# Patient Record
Sex: Female | Born: 1940 | Race: White | Hispanic: No | Smoking: Former smoker
Health system: Southern US, Community
[De-identification: ages and names within clinical notes are randomized; demographics above are authoritative.]

## PROBLEM LIST (undated history)

## (undated) DIAGNOSIS — E559 Vitamin D deficiency, unspecified: Secondary | ICD-10-CM

## (undated) DIAGNOSIS — K5730 Diverticulosis of large intestine without perforation or abscess without bleeding: Secondary | ICD-10-CM

## (undated) DIAGNOSIS — R490 Dysphonia: Secondary | ICD-10-CM

## (undated) DIAGNOSIS — C50919 Malignant neoplasm of unspecified site of unspecified female breast: Secondary | ICD-10-CM

## (undated) DIAGNOSIS — E663 Overweight: Secondary | ICD-10-CM

## (undated) DIAGNOSIS — J381 Polyp of vocal cord and larynx: Secondary | ICD-10-CM

## (undated) DIAGNOSIS — K635 Polyp of colon: Secondary | ICD-10-CM

## (undated) DIAGNOSIS — Z923 Personal history of irradiation: Secondary | ICD-10-CM

## (undated) DIAGNOSIS — I639 Cerebral infarction, unspecified: Secondary | ICD-10-CM

## (undated) DIAGNOSIS — I341 Nonrheumatic mitral (valve) prolapse: Secondary | ICD-10-CM

## (undated) DIAGNOSIS — F341 Dysthymic disorder: Secondary | ICD-10-CM

## (undated) DIAGNOSIS — K219 Gastro-esophageal reflux disease without esophagitis: Secondary | ICD-10-CM

## (undated) DIAGNOSIS — E7800 Pure hypercholesterolemia, unspecified: Secondary | ICD-10-CM

## (undated) HISTORY — DX: Gastro-esophageal reflux disease without esophagitis: K21.9

## (undated) HISTORY — DX: Vitamin D deficiency, unspecified: E55.9

## (undated) HISTORY — DX: Diverticulosis of large intestine without perforation or abscess without bleeding: K57.30

## (undated) HISTORY — DX: Cerebral infarction, unspecified: I63.9

## (undated) HISTORY — DX: Nonrheumatic mitral (valve) prolapse: I34.1

## (undated) HISTORY — DX: Polyp of colon: K63.5

## (undated) HISTORY — DX: Dysthymic disorder: F34.1

## (undated) HISTORY — PX: APPENDECTOMY: SHX54

## (undated) HISTORY — DX: Polyp of vocal cord and larynx: J38.1

## (undated) HISTORY — DX: Dysphonia: R49.0

## (undated) HISTORY — PX: BREAST EXCISIONAL BIOPSY: SUR124

## (undated) HISTORY — PX: BREAST LUMPECTOMY: SHX2

## (undated) HISTORY — DX: Pure hypercholesterolemia, unspecified: E78.00

## (undated) HISTORY — PX: BREAST BIOPSY: SHX20

## (undated) HISTORY — DX: Overweight: E66.3

## (undated) NOTE — *Deleted (*Deleted)
Patient Care Team: Adrian Prince, MD as PCP - General (Endocrinology)  DIAGNOSIS: No diagnosis found.  SUMMARY OF ONCOLOGIC HISTORY: Oncology History  Ductal carcinoma in situ (DCIS) of right breast  07/26/2014 Initial Diagnosis   Stereotactic biopsy right breast 12:00: Intraductal hyperplasia with no atypia, 18 mm group of calcifications 8 o'clock position high-grade DCIS ER 95% PR 60% positive at [facility]   08/09/2014 Surgery   Right lumpectomy followed by reexcision for negative margins   11/01/2014 - 12/01/2014 Radiation Therapy   Adjuvant radiation therapy   01/01/2015 -  Anti-estrogen oral therapy   Anastrozole 1 mg daily     CHIEF COMPLIANT: Follow-up of right breast DCIS on anastrozole  INTERVAL HISTORY: Victoria Thornton is a 42 y.o. with above-mentioned history of right breast DCIS treated with a lumpectomy, radiation, and who is currently on anti-estrogen therapy with anastrozole. Mammogram on 05/09/19 showed no evidence of malignancy bilaterally. She presents to the clinic today for annual follow-up.   ALLERGIES:  has No Known Allergies.  MEDICATIONS:  Current Outpatient Medications  Medication Sig Dispense Refill  . anastrozole (ARIMIDEX) 1 MG tablet TAKE 1 TABLET BY MOUTH EVERY DAY 90 tablet 3  . Calcium Carbonate-Vitamin D (CALTRATE 600+D) 600-400 MG-UNIT per tablet Take 1 tablet by mouth 2 (two) times daily.     . Cholecalciferol (VITAMIN D3) 2000 UNITS TABS Take 2,000 Units by mouth daily.     Marland Kitchen glyBURIDE (DIABETA) 5 MG tablet Take 2 tablets (10 mg total) by mouth daily with breakfast.    . lisinopril (PRINIVIL,ZESTRIL) 10 MG tablet Take 1 tablet (10 mg total) by mouth daily. 90 tablet 3  . magnesium oxide (MAG-OX) 400 (241.3 Mg) MG tablet Take 1 tablet (400 mg total) by mouth daily.    . metFORMIN (GLUCOPHAGE) 500 MG tablet Take 2 tablets (1,000 mg total) by mouth 2 (two) times daily with a meal. 180 tablet 1  . Multiple Vitamins-Minerals  (WOMENS DAILY FORMULA PO) Take 1 capsule by mouth daily.      . sertraline (ZOLOFT) 50 MG tablet Take 50 mg by mouth daily.    . simvastatin (ZOCOR) 20 MG tablet Take 1 tablet (20 mg total) by mouth at bedtime. 90 tablet 0  . sitaGLIPtin (JANUVIA) 100 MG tablet Take 1 tablet (100 mg total) by mouth daily.    Marland Kitchen zolpidem (AMBIEN) 10 MG tablet Take 0.5 tablets (5 mg total) by mouth at bedtime as needed for sleep.     No current facility-administered medications for this visit.    PHYSICAL EXAMINATION: ECOG PERFORMANCE STATUS: {CHL ONC ECOG PS:[phone]}  There were no vitals filed for this visit. There were no vitals filed for this visit.  BREAST:*** No palpable masses or nodules in either right or left breasts. No palpable axillary supraclavicular or infraclavicular adenopathy no breast tenderness or nipple discharge. (exam performed in the presence of a chaperone)  LABORATORY DATA:  I have reviewed the data as listed CMP Latest Ref Rng & Units 03/04/2019 03/03/2019 03/01/2019  Glucose 70 - 99 mg/dL - 409(W) 119(J)  BUN 8 - 23 mg/dL - 19 15  Creatinine 4.78 - 1.00 mg/dL - 2.95(A) 2.13  Sodium 135 - 145 mmol/L 134(L) 135 138  Potassium 3.5 - 5.1 mmol/L 4.1 4.1 4.3  Chloride 98 - 111 mmol/L - 99 101  CO2 22 - 32 mmol/L - 25 26  Calcium 8.9 - 10.3 mg/dL - 9.2 9.6  Total Protein 6.0 - 8.3 g/dL - - -  Total Bilirubin 0.3 - 1.2 mg/dL - - -  Alkaline Phos 39 - 117 U/L - - -  AST 0 - 37 U/L - - -  ALT 0 - 35 U/L - - -    Lab Results  Component Value Date   WBC 13.5 (H) 03/03/2019   HGB 10.5 (L) 03/04/2019   HCT 31.0 (L) 03/04/2019   MCV 92.1 03/03/2019   PLT 226 03/03/2019   NEUTROABS 8.6 (H) 03/03/2019    ASSESSMENT & PLAN:  No problem-specific Assessment & Plan notes found for this encounter.    No orders of the defined types were placed in this encounter.  The patient has a good understanding of the overall plan. she agrees with it. she will call with any problems that  may develop before the next visit here.  Total time spent: *** mins including face to face time and time spent for planning, charting and coordination of care  Serena Croissant, MD 03/26/2020  I, Kirt Boys Dorshimer, am acting as scribe for Dr. Serena Croissant.  {insert scribe attestation}

---
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
## 1993-05-19 HISTORY — PX: BREAST CYST ASPIRATION: SHX578

## 1996-05-19 HISTORY — PX: OTHER SURGICAL HISTORY: SHX169

## 1997-09-20 ENCOUNTER — Ambulatory Visit (HOSPITAL_BASED_OUTPATIENT_CLINIC_OR_DEPARTMENT_OTHER): Admission: RE | Admit: 1997-09-20 | Discharge: 1997-09-20 | Payer: Self-pay | Admitting: *Deleted

## 1998-03-22 ENCOUNTER — Ambulatory Visit (HOSPITAL_COMMUNITY): Admission: RE | Admit: 1998-03-22 | Discharge: 1998-03-22 | Payer: Self-pay | Admitting: Pulmonary Disease

## 1998-03-22 ENCOUNTER — Encounter: Payer: Self-pay | Admitting: Pulmonary Disease

## 1998-04-09 ENCOUNTER — Ambulatory Visit (HOSPITAL_COMMUNITY): Admission: RE | Admit: 1998-04-09 | Discharge: 1998-04-09 | Payer: Self-pay | Admitting: Pulmonary Disease

## 1998-04-09 ENCOUNTER — Encounter: Payer: Self-pay | Admitting: Pulmonary Disease

## 1998-06-26 ENCOUNTER — Other Ambulatory Visit: Admission: RE | Admit: 1998-06-26 | Discharge: 1998-06-26 | Payer: Self-pay | Admitting: Obstetrics and Gynecology

## 1999-10-07 ENCOUNTER — Other Ambulatory Visit: Admission: RE | Admit: 1999-10-07 | Discharge: 1999-10-07 | Payer: Self-pay | Admitting: Obstetrics and Gynecology

## 2000-12-07 ENCOUNTER — Other Ambulatory Visit: Admission: RE | Admit: 2000-12-07 | Discharge: 2000-12-07 | Payer: Self-pay | Admitting: Obstetrics and Gynecology

## 2001-05-11 ENCOUNTER — Emergency Department (HOSPITAL_COMMUNITY): Admission: EM | Admit: 2001-05-11 | Discharge: 2001-05-11 | Payer: Self-pay | Admitting: Emergency Medicine

## 2001-05-11 ENCOUNTER — Encounter: Payer: Self-pay | Admitting: Emergency Medicine

## 2001-11-29 ENCOUNTER — Other Ambulatory Visit: Admission: RE | Admit: 2001-11-29 | Discharge: 2001-11-29 | Payer: Self-pay | Admitting: Obstetrics and Gynecology

## 2002-06-08 ENCOUNTER — Encounter: Admission: RE | Admit: 2002-06-08 | Discharge: 2002-09-06 | Payer: Self-pay | Admitting: Endocrinology

## 2002-10-03 ENCOUNTER — Encounter: Admission: RE | Admit: 2002-10-03 | Discharge: 2003-01-01 | Payer: Self-pay | Admitting: Endocrinology

## 2003-02-13 ENCOUNTER — Other Ambulatory Visit: Admission: RE | Admit: 2003-02-13 | Discharge: 2003-02-13 | Payer: Self-pay | Admitting: Obstetrics and Gynecology

## 2004-03-04 ENCOUNTER — Other Ambulatory Visit: Admission: RE | Admit: 2004-03-04 | Discharge: 2004-03-04 | Payer: Self-pay | Admitting: Obstetrics and Gynecology

## 2004-03-08 ENCOUNTER — Ambulatory Visit (HOSPITAL_COMMUNITY): Admission: RE | Admit: 2004-03-08 | Discharge: 2004-03-08 | Payer: Self-pay | Admitting: Orthopaedic Surgery

## 2004-04-05 ENCOUNTER — Ambulatory Visit: Payer: Self-pay | Admitting: Pulmonary Disease

## 2004-04-20 ENCOUNTER — Inpatient Hospital Stay (HOSPITAL_COMMUNITY): Admission: EM | Admit: 2004-04-20 | Discharge: 2004-04-21 | Payer: Self-pay | Admitting: Emergency Medicine

## 2004-04-21 ENCOUNTER — Inpatient Hospital Stay (HOSPITAL_COMMUNITY): Admission: EM | Admit: 2004-04-21 | Discharge: 2004-04-23 | Payer: Self-pay | Admitting: Psychiatry

## 2004-04-21 ENCOUNTER — Ambulatory Visit: Payer: Self-pay | Admitting: Psychiatry

## 2004-11-19 ENCOUNTER — Emergency Department (HOSPITAL_COMMUNITY): Admission: EM | Admit: 2004-11-19 | Discharge: 2004-11-19 | Payer: Self-pay | Admitting: Emergency Medicine

## 2005-03-31 ENCOUNTER — Other Ambulatory Visit: Admission: RE | Admit: 2005-03-31 | Discharge: 2005-03-31 | Payer: Self-pay | Admitting: Obstetrics and Gynecology

## 2005-04-17 ENCOUNTER — Ambulatory Visit: Payer: Self-pay | Admitting: Pulmonary Disease

## 2005-05-08 ENCOUNTER — Ambulatory Visit: Payer: Self-pay | Admitting: Pulmonary Disease

## 2006-06-15 ENCOUNTER — Other Ambulatory Visit: Admission: RE | Admit: 2006-06-15 | Discharge: 2006-06-15 | Payer: Self-pay | Admitting: Obstetrics & Gynecology

## 2006-06-16 ENCOUNTER — Ambulatory Visit: Payer: Self-pay | Admitting: Pulmonary Disease

## 2006-06-16 LAB — CONVERTED CEMR LAB
AST: 14 units/L (ref 0–37)
Alkaline Phosphatase: 64 units/L (ref 39–117)
Basophils Relative: 0.9 % (ref 0.0–1.0)
CO2: 30 meq/L (ref 19–32)
Chloride: 106 meq/L (ref 96–112)
Creatinine, Ser: 0.8 mg/dL (ref 0.4–1.2)
HCT: 39 % (ref 36.0–46.0)
Hemoglobin: 13.7 g/dL (ref 12.0–15.0)
Hgb A1c MFr Bld: 6.2 % — ABNORMAL HIGH (ref 4.6–6.0)
LDL Cholesterol: 112 mg/dL — ABNORMAL HIGH (ref 0–99)
Monocytes Absolute: 0.5 10*3/uL (ref 0.2–0.7)
Neutrophils Relative %: 64.5 % (ref 43.0–77.0)
Potassium: 4.4 meq/L (ref 3.5–5.1)
RBC: 4.48 M/uL (ref 3.87–5.11)
RDW: 11.8 % (ref 11.5–14.6)
Sodium: 142 meq/L (ref 135–145)
TSH: 1.76 microintl units/mL (ref 0.35–5.50)
Total Bilirubin: 0.8 mg/dL (ref 0.3–1.2)
Total Protein: 7.4 g/dL (ref 6.0–8.3)
VLDL: 33 mg/dL (ref 0–40)
WBC: 6.8 10*3/uL (ref 4.5–10.5)

## 2006-11-30 ENCOUNTER — Ambulatory Visit: Payer: Self-pay | Admitting: Gastroenterology

## 2006-12-04 ENCOUNTER — Ambulatory Visit: Payer: Self-pay | Admitting: Gastroenterology

## 2007-06-17 DIAGNOSIS — K219 Gastro-esophageal reflux disease without esophagitis: Secondary | ICD-10-CM

## 2007-06-17 DIAGNOSIS — D126 Benign neoplasm of colon, unspecified: Secondary | ICD-10-CM

## 2007-06-18 ENCOUNTER — Ambulatory Visit: Payer: Self-pay | Admitting: Pulmonary Disease

## 2007-06-18 ENCOUNTER — Encounter: Payer: Self-pay | Admitting: Adult Health

## 2007-06-18 DIAGNOSIS — I059 Rheumatic mitral valve disease, unspecified: Secondary | ICD-10-CM | POA: Insufficient documentation

## 2007-06-18 DIAGNOSIS — J42 Unspecified chronic bronchitis: Secondary | ICD-10-CM | POA: Insufficient documentation

## 2007-06-18 DIAGNOSIS — K5730 Diverticulosis of large intestine without perforation or abscess without bleeding: Secondary | ICD-10-CM | POA: Insufficient documentation

## 2007-06-18 DIAGNOSIS — E663 Overweight: Secondary | ICD-10-CM | POA: Insufficient documentation

## 2007-06-18 DIAGNOSIS — E7800 Pure hypercholesterolemia, unspecified: Secondary | ICD-10-CM | POA: Insufficient documentation

## 2007-06-18 DIAGNOSIS — E119 Type 2 diabetes mellitus without complications: Secondary | ICD-10-CM | POA: Insufficient documentation

## 2007-06-18 DIAGNOSIS — F341 Dysthymic disorder: Secondary | ICD-10-CM

## 2007-06-18 DIAGNOSIS — J381 Polyp of vocal cord and larynx: Secondary | ICD-10-CM

## 2007-06-18 DIAGNOSIS — R498 Other voice and resonance disorders: Secondary | ICD-10-CM

## 2007-06-25 LAB — CONVERTED CEMR LAB: Vit D, 1,25-Dihydroxy: 23 — ABNORMAL LOW (ref 30–89)

## 2007-06-26 LAB — CONVERTED CEMR LAB
ALT: 10 units/L (ref 0–35)
AST: 18 units/L (ref 0–37)
Alkaline Phosphatase: 64 units/L (ref 39–117)
BUN: 9 mg/dL (ref 6–23)
Basophils Relative: 0.9 % (ref 0.0–1.0)
CO2: 31 meq/L (ref 19–32)
Calcium: 9.3 mg/dL (ref 8.4–10.5)
Chloride: 101 meq/L (ref 96–112)
Crystals: NEGATIVE
Eosinophils Absolute: 0.2 10*3/uL (ref 0.0–0.6)
Eosinophils Relative: 2.4 % (ref 0.0–5.0)
GFR calc Af Amer: 108 mL/min
Glucose, Bld: 136 mg/dL — ABNORMAL HIGH (ref 70–99)
HCT: 37.4 % (ref 36.0–46.0)
HDL: 45.8 mg/dL (ref >39.0)
Ketones, ur: NEGATIVE mg/dL
MCV: 87.5 fL (ref 78.0–100.0)
Mucus, UA: NEGATIVE
Neutrophils Relative %: 61 % (ref 43.0–77.0)
Platelets: 294 10*3/uL (ref 150–400)
RBC: 4.28 M/uL (ref 3.87–5.11)
TSH: 3.23 microintl units/mL (ref 0.35–5.50)
Total Protein, Urine: NEGATIVE mg/dL
Total Protein: 7.6 g/dL (ref 6.0–8.3)
Triglycerides: 132 mg/dL (ref 0–149)
VLDL: 26 mg/dL (ref 0–40)
WBC: 9 10*3/uL (ref 4.5–10.5)

## 2007-06-29 ENCOUNTER — Telehealth (INDEPENDENT_AMBULATORY_CARE_PROVIDER_SITE_OTHER): Payer: Self-pay | Admitting: *Deleted

## 2008-04-05 ENCOUNTER — Ambulatory Visit: Payer: Self-pay | Admitting: Pulmonary Disease

## 2008-07-31 ENCOUNTER — Ambulatory Visit: Payer: Self-pay | Admitting: Pulmonary Disease

## 2008-08-01 ENCOUNTER — Ambulatory Visit: Payer: Self-pay | Admitting: Family Medicine

## 2008-08-01 ENCOUNTER — Ambulatory Visit: Payer: Self-pay | Admitting: Pulmonary Disease

## 2008-08-01 ENCOUNTER — Other Ambulatory Visit: Admission: RE | Admit: 2008-08-01 | Discharge: 2008-08-01 | Payer: Self-pay | Admitting: Obstetrics & Gynecology

## 2008-08-02 LAB — CONVERTED CEMR LAB
Albumin: 3.9 g/dL (ref 3.5–5.2)
Basophils Relative: 0.8 % (ref 0.0–3.0)
Bilirubin Urine: NEGATIVE
CO2: 30 meq/L (ref 19–32)
Calcium: 9.1 mg/dL (ref 8.4–10.5)
Creatinine, Ser: 0.4 mg/dL (ref 0.4–1.2)
Creatinine,U: 220.7 mg/dL
Eosinophils Relative: 3 % (ref 0.0–5.0)
Folate: 12.3 ng/mL
GFR calc non Af Amer: 168.92 mL/min (ref >60)
Glucose, Bld: 214 mg/dL — ABNORMAL HIGH (ref 70–99)
HDL: 37.8 mg/dL — ABNORMAL LOW (ref >39.00)
Hemoglobin: 13.5 g/dL (ref 12.0–15.0)
Lymphocytes Relative: 38.1 % (ref 12.0–46.0)
Microalb Creat Ratio: 5.4 mg/g (ref 0.0–30.0)
Monocytes Relative: 5.8 % (ref 3.0–12.0)
Neutro Abs: 4.3 10*3/uL (ref 1.4–7.7)
Nitrite: NEGATIVE
RBC: 4.49 M/uL (ref 3.87–5.11)
Total CHOL/HDL Ratio: 5
Total Protein, Urine: NEGATIVE mg/dL
Total Protein: 7.3 g/dL (ref 6.0–8.3)
Triglycerides: 134 mg/dL (ref 0.0–149.0)
Urobilinogen, UA: 0.2 (ref 0.0–1.0)
VLDL: 26.8 mg/dL (ref 0.0–40.0)
Vitamin B-12: 485 pg/mL (ref 211–911)

## 2008-08-09 ENCOUNTER — Telehealth: Payer: Self-pay | Admitting: Pulmonary Disease

## 2008-11-23 ENCOUNTER — Encounter: Payer: Self-pay | Admitting: Pulmonary Disease

## 2008-11-29 ENCOUNTER — Encounter: Payer: Self-pay | Admitting: Pulmonary Disease

## 2009-01-30 ENCOUNTER — Ambulatory Visit: Payer: Self-pay | Admitting: Pulmonary Disease

## 2009-02-04 LAB — CONVERTED CEMR LAB
BUN: 16 mg/dL (ref 6–23)
Chloride: 104 meq/L (ref 96–112)
Hgb A1c MFr Bld: 7.9 % — ABNORMAL HIGH (ref 4.6–6.5)
Potassium: 4.3 meq/L (ref 3.5–5.1)

## 2009-02-05 ENCOUNTER — Telehealth: Payer: Self-pay | Admitting: Pulmonary Disease

## 2009-02-20 ENCOUNTER — Telehealth: Payer: Self-pay | Admitting: Pulmonary Disease

## 2009-07-31 ENCOUNTER — Ambulatory Visit: Payer: Self-pay | Admitting: Pulmonary Disease

## 2009-08-01 ENCOUNTER — Ambulatory Visit: Payer: Self-pay | Admitting: Pulmonary Disease

## 2009-08-04 DIAGNOSIS — E559 Vitamin D deficiency, unspecified: Secondary | ICD-10-CM | POA: Insufficient documentation

## 2009-08-04 LAB — CONVERTED CEMR LAB
BUN: 19 mg/dL (ref 6–23)
Basophils Relative: 0.4 % (ref 0.0–3.0)
Cholesterol: 194 mg/dL (ref 0–200)
Creatinine, Ser: 0.8 mg/dL (ref 0.4–1.2)
Eosinophils Relative: 3 % (ref 0.0–5.0)
GFR calc non Af Amer: 75.68 mL/min (ref >60)
HCT: 40 % (ref 36.0–46.0)
HDL: 50 mg/dL (ref >39.00)
Hemoglobin: 13.2 g/dL (ref 12.0–15.0)
Lymphs Abs: 3.2 10*3/uL (ref 0.7–4.0)
MCV: 88.1 fL (ref 78.0–100.0)
Monocytes Absolute: 0.5 10*3/uL (ref 0.1–1.0)
Potassium: 4.2 meq/L (ref 3.5–5.1)
RBC: 4.54 M/uL (ref 3.87–5.11)
Total Bilirubin: 0.4 mg/dL (ref 0.3–1.2)
VLDL: 40.2 mg/dL — ABNORMAL HIGH (ref 0.0–40.0)
WBC: 8.7 10*3/uL (ref 4.5–10.5)

## 2009-10-25 ENCOUNTER — Telehealth (INDEPENDENT_AMBULATORY_CARE_PROVIDER_SITE_OTHER): Payer: Self-pay | Admitting: *Deleted

## 2009-12-14 ENCOUNTER — Encounter: Payer: Self-pay | Admitting: Pulmonary Disease

## 2009-12-20 ENCOUNTER — Telehealth: Payer: Self-pay | Admitting: Pulmonary Disease

## 2010-01-28 ENCOUNTER — Ambulatory Visit: Payer: Self-pay | Admitting: Pulmonary Disease

## 2010-01-29 LAB — CONVERTED CEMR LAB
CO2: 31 meq/L (ref 19–32)
GFR calc non Af Amer: 70.47 mL/min (ref >60)
Glucose, Bld: 165 mg/dL — ABNORMAL HIGH (ref 70–99)
Potassium: 4.1 meq/L (ref 3.5–5.1)
Sodium: 142 meq/L (ref 135–145)

## 2010-06-17 ENCOUNTER — Telehealth: Payer: Self-pay | Admitting: Pulmonary Disease

## 2010-06-18 NOTE — Progress Notes (Signed)
Summary: refills  metformin and glimepiride  Phone Note Call from Patient Call back at Home Phone [phone]   Caller: Patient Call For: Aaliayah Miao Reason for Call: Refill Medication Summary of Call: Needs written rxs for metformin hcl 500mg  amd glimepiride 2mg  90-day supply with three refills mailed to her. Initial call taken by: Darletta Moll,  December 20, 2009 1:35 PM  Follow-up for Phone Call        pt was last seen by SN 07-31-2009 and is scheduled for 6 month f/u with SN on 01-28-2010.  Printed rx for Sn to sign   rx have been placed in the mail for pt [name] CMA  December 20, 2009 3:44 PM     Prescriptions: GLIMEPIRIDE 2 MG TABS (GLIMEPIRIDE) take 1 tab by mouth once daily in the AM...  #90 x 3   Entered by:   Arman Filter LPN   Authorized by:   Michele Mcalpine MD   Signed by:   Arman Filter LPN on 09/81/1914   Method used:   Print then Mail to Patient   RxID:   [id number] METFORMIN HCL 500 MG TABS (METFORMIN HCL) 1 by mouth two times a day  #180 x 3   Entered by:   Arman Filter LPN   Authorized by:   Michele Mcalpine MD   Signed by:   Arman Filter LPN on 69/62/9528   Method used:   Print then Mail to Patient   RxID:   [id number]

## 2010-06-18 NOTE — Assessment & Plan Note (Signed)
Summary: rov 6 months///kp   Primary Care Provider:  Kriste Basque  CC:  6 month ROV & review of mult medical problems....  History of Present Illness: 70 y/o WF here for a yearly follow up visit... she lives in [place], [place] but still maintains her medical care here in [place]...     ~  Mar10:  she reports a rough time of it lately- mostly revolving around depression w/ no energy, no motivation, ahedonia, etc... she is followed by [facility]/textile technologist) and sees psychologist Phylliss Blakes... she is on Celexa 40mg /d, and wonders if Adderall may be more beneficial to her (a girlfriend rec this med)...  [place] to [place] when able... she wants to be sure there is no medical reason for her to feel this way. <labs showed BS=214 & Metform500Bid started)>  ~  Sep10:  recent travel to [place] and congested etc- took ZPak & improved... 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 started on Metformin 500Bid after last OV, but not checking BS at home... had eye check from Optometrist w/ report of early retinopathy & rec for 59mo follow up... also had BMD 3/10 which was WNL.. <labs showed BS=194, A1c=7.9 & Glimep2mg  started>   ~  July 31, 2009:  feeling OK & preparing for trip to [place]- had polio booster, has malaria med, wants ZPak... states BS 120-140 range but wt up to 207#... otherw no new complaints or concerns... 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 she is an ex-smoker and quit  ~ 22yrs ago... seen by DrShoemaker for ENT in 1998.  Hx of BRONCHITIS, CHRONIC (ICD-491.9) - ex-smoker quit  ~2001... she has min cough, no sputum, chr DOE without change, and denies CP, palpit, CHF symptoms, edema, etc...  ~  CXR 3/10 showed clear, NAD.Marland Kitchen. stable, no change 3/11...  MITRAL VALVE PROLAPSE (ICD-424.0)  - clinical dx... never had 2DEcho... intermittent msc heard over the years... no CP, palpit, etc...  HYPERCHOLESTEROLEMIA - prev on meds but pt stopped them on her own and is using red yeast rice and OTC fish oil supplements...   ~  FLP 1/08 showed TChol 187, TG 165, HDL 42, LDL 112... she would consider statin therapy is "absolutely necessary" based on her labs, but is more inclined to use diet and supplements...  ~  FLP 1/09 showed TChol 205, TG 132, HDL 46, LDL 141... offered low dose Crestor, she prefers diet.  ~  FLP 3/10 showed TChol 195, TG 134, HDL 38, LDL 130  ~  FLP 3/11 showed TChol 194, TG 201, HDL 50, LDL 126  DIABETES MELLITUS - review of chart show FBS ~120-130 range & HgA1c= low6's for several yrs on diet alone... she is asymptomatic, prev eval by endocrinologist in [place], [place]...  ~  labs 1/09  (wt=210#) showed FBS= 136, HgA1c= 6.9.Marland KitchenMarland Kitchen rec to start meds but she prefers diet Rx...  ~  labs 3/10 (wt=209#) showed BS= 214, A1c= not done... rec> start METFORMIN 500Bid.  ~  labs 9/10 (wt=200#) showed BS= 194, A1c= 7.9.Marland Kitchen. rec> add GLIMEPIRIDE 2mg /d.  ~  labs 3/11 (wt=207#) showed BS= 160, A1c= 6.9.Marland KitchenMarland Kitchen continue both meds.  OVERWEIGHT (ICD-278.02) - not really dieting or exercising by her admission... we discussed diet + exercise program--- and we gave her  the phone # for [facility] to consider Bariatric options...  GERD (ICD-530.81) - some reflux laryngitis suspected in the past... without prev EGD... she has had increased heartburn symptoms recently and we discussed starting PRILOSEC 20mg /d taken 30 min before dinner...  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 f/u planned 5 yrs.  VITAMIN D DEFICIENCY (ICD-268.9)  ~  labs 3/11 showed Vit D level = 20... rec> start 2000 u Vit D OTC daily...  DYSTHYMIA (ICD-300.4) - hx anxiety and depression in past... [facility] (sees Dr. Jules Schick) and prev on Celexa... prev suicidal gesture w/ tylenol overdose after divorce from her 3rd husb...   ~  3/10:  she notes increased symptoms of depression and will f/u w DrPittman and psychologist Phylliss Blakes...   Allergies: 1)  ! Metformin Hcl (Metformin Hcl) 2)  ! Lipitor (Atorvastatin Calcium)  Comments:  Nurse/Medical Assistant: The patient's medications and allergies were reviewed with the patient and were updated in the Medication and Allergy Lists.  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 Father died age 47 w/ MI, CABG, prostate cancer Mother died age 1 w/ DM & depression 2 Brothers- Donnell Lambe, our pt w/ hx of Chol, HBP...  Social History: Reviewed history from 07/31/2008 and no changes required. Divorced 1 daughter= [place], age 65 w/ MS Former smoker.  Quit in 1999.  Smoked 1 ppd x 40 years. 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  The patient denies anorexia, fever, weight loss, weight gain, vision loss, decreased hearing, chest pain, syncope, peripheral edema, prolonged cough, headaches, hemoptysis, abdominal pain, melena, hematochezia, severe indigestion/heartburn, hematuria, incontinence, muscle weakness, suspicious skin lesions, transient blindness, difficulty walking, depression, unusual weight change,  abnormal bleeding, enlarged lymph nodes, and angioedema.    Vital Signs:  Patient profile:   70 year old female Height:      64 inches Weight:      206.38 pounds BMI:     35.55 O2 Sat:      97 % on Room air Temp:     97.1 degrees F oral Pulse rate:   70 / minute BP sitting:   124 / 78  (left arm) Cuff size:   regular  Vitals Entered By: [name] CMA (July 31, 2009 11:51 AM)  O2 Sat at Rest %:  97 O2 Flow:  Room air CC: 6 month ROV & review of mult medical problems... Is Patient Diabetic? Yes Pain Assessment Patient in pain? no      Comments meds updated today   Physical Exam  Additional Exam:  WD, Overweight, 70 y/o WF in NAD... GENERAL:  Alert & oriented; pleasant & cooperative. HEENT:  [place]/AT, EOM-wnl, PERRLA, Fundi-benign, EACs-clear, TMs-wnl, NOSE-clear, THROAT-clear & wnl, sl hoarse without change. NECK:  Supple w/ fairROM; no JVD; normal carotid impulses w/o bruits; no thyromegaly or nodules palpated; no lymphadenopathy. CHEST:  Clear without wheezes, rales, or rhonchi heard... HEART:  Regular Rhythm; without murmurs/ rubs/ or gallops detected... ABDOMEN:  Soft & nontender; normal bowel sounds; no organomegaly or masses palpated... EXT: without deformities, mild arthritic changes; no varicose veins/ venous insuffic/ or edema. NEURO:  CN's intact, no focal neuro deficits... DERM:  neg- without lesions seen, no rash, etc...    CXR  Procedure date:  07/31/2009  Findings:      CHEST - 2 VIEW Comparison: None.   Findings:  The heart size and mediastinal contours are within normal limits.  Both lungs are clear.  The visualized skeletal structures are unremarkable.   IMPRESSION: No active cardiopulmonary disease.   Read By:  Jonne Ply,  M.D.   MISC. Report  Procedure date:  08/01/2009  Findings:      Lipid Panel (LIPID)   Cholesterol               194 mg/dL                   4-332   Triglycerides        [H]  201.0 mg/dL                  9.5-188.4   HDL                       16.60 mg/dL                 >63.01 Cholesterol LDL - Direct                             126.1 mg/dL  BMP (METABOL)   Sodium                    140 mEq/L                   135-145   Potassium                 4.2 mEq/L                   3.5-5.1   Chloride                  105 mEq/L                   96-112   Carbon Dioxide            28 mEq/L                    19-32   Glucose              [H]  160 mg/dL                   60-10   BUN                       19 mg/dL                    9-32   Creatinine                0.8 mg/dL                   3.5-5.7   Calcium                   9.3 mg/dL  8.4-10.5   GFR                       75.68 mL/min                >60  Hepatic/Liver Function Panel (HEPATIC)   Total Bilirubin           0.4 mg/dL                   1.3-0.8   Direct Bilirubin          0.2 mg/dL                   6.5-7.8   Alkaline Phosphatase      64 U/L                      39-117   AST                       16 U/L                      0-37   ALT                       10 U/L                      0-35   Total Protein             7.5 g/dL                    4.6-9.6   Albumin                   4.2 g/dL                    2.9-5.2  Comments:      CBC Platelet w/Diff (CBCD)   White Cell Count          8.7 K/uL                    4.5-10.5   Red Cell Count            4.54 Mil/uL                 3.87-5.11   Hemoglobin                13.2 g/dL                   84.1-32.4   Hematocrit                40.0 %                      36.0-46.0   MCV                       88.1 fl                     78.0-100.0   Platelet Count            259.0 K/uL                  150.0-400.0   Neutrophil %              53.9 %  43.0-77.0   Lymphocyte %              36.5 %                      12.0-46.0   Monocyte %                6.2 %                       3.0-12.0   Eosinophils%              3.0 %                       0.0-5.0   Basophils  %               0.4 %                       0.0-3.0  TSH (TSH)   FastTSH                   3.29 uIU/mL                 0.35-5.50  Hemoglobin A1C (A1C)   Hemoglobin A1C       [H]  6.9 %                       4.6-6.5  Vitamin D (25-Hydroxy) (30865)  Vitamin D (25-Hydroxy)                        [L]  20 ng/mL                    30-89   Impression & Recommendations:  Problem # 1:  Hx of BRONCHITIS, CHRONIC (ICD-491.9) CXR clear, no recent exac... Orders: T-2 View CXR (71020TC)  Problem # 2:  HYPERCHOLESTEROLEMIA (ICD-272.0) FLP similar on diet alone, she declines statin Rx.  Problem # 3:  DIABETES MELLITUS (ICD-250.00) BS & A1c sl improved... continue meds, needs better diet!!! The following medications were removed from the medication list:    Aspirin 81 Mg Tbec (Aspirin) .Marland Kitchen... Take 1 tab by mouth once daily... Her updated medication list for this problem includes:    Metformin Hcl 500 Mg Tabs (Metformin hcl) .Marland Kitchen... 1 by mouth two times a day    Glimepiride 2 Mg Tabs (Glimepiride) .Marland Kitchen... Take 1 tab by mouth once daily in the am...  Problem # 4:  OVERWEIGHT (ICD-278.02) Weight reduction is key...  Problem # 5:  GERD (ICD-530.81) GI is stable and up to date...  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  Problem # 7:  VITAMIN D DEFICIENCY (ICD-268.9) Start Vit D 2000 u OTC daily...  Complete Medication List: 1)  Metformin Hcl 500 Mg Tabs (Metformin hcl) .Marland Kitchen.. 1 by mouth two times a day 2)  Glimepiride 2 Mg Tabs (Glimepiride) .... Take 1 tab by mouth once daily in the am... 3)  Zithromax Z-pak 250 Mg Tabs (Azithromycin) .... Take as directed...  Other Orders: Prescription Created Electronically [phone])  Patient Instructions: 1)  Today we updated your med list- see below.... 2)  We refilled your meds per request & added a ZPak for as needed use.Marland KitchenMarland Kitchen 3)  Today we did your follow up CXR, please return to our lab in the AM for your  FASTING  blood work... then call the "phone tree" in a few days for your lab results.Marland KitchenMarland Kitchen 4)  Let's get on track w/ our diet & exercise program... the goal is to lose 15-20 lbs... 5)  Have a great time in [place]!!! 6)  Call for any questions.Marland KitchenMarland Kitchen 7)  Please schedule a follow-up appointment in 6 months. Prescriptions: ZITHROMAX Z-PAK 250 MG TABS (AZITHROMYCIN) take as directed...  #1 pack x 2   Entered and Authorized by:   Michele Mcalpine MD   Signed by:   Michele Mcalpine MD on 07/31/2009   Method used:   Print then Give to Patient   RxID:   [id number] GLIMEPIRIDE 2 MG TABS (GLIMEPIRIDE) take 1 tab by mouth once daily in the AM...  #30 x prn   Entered and Authorized by:   Michele Mcalpine MD   Signed by:   Michele Mcalpine MD on 07/31/2009   Method used:   Print then Give to Patient   RxID:   [id number] METFORMIN HCL 500 MG TABS (METFORMIN HCL) 1 by mouth two times a day  #60 x prn   Entered and Authorized by:   Michele Mcalpine MD   Signed by:   Michele Mcalpine MD on 07/31/2009   Method used:   Print then Give to Patient   RxID:   [id number]

## 2010-06-18 NOTE — Assessment & Plan Note (Signed)
Summary: 41M RECK/KLW   Primary Care Provider:  Kriste Basque  CC:  6 month ROV & review of mult medical problems....  History of Present Illness: 70 y/o WF here for a yearly follow up visit... she lives in [place], [place] but still maintains her medical care here in [place].   ~  Mar10:  she reports a rough time of it lately- mostly revolving around depression w/ no energy, no motivation, ahedonia, etc... she is followed by [facility]/textile technologist) and sees psychologist Phylliss Blakes... she is on Celexa 40mg /d, and wonders if Adderall may be more beneficial to her (a girlfriend rec this med)...  [place]... she wants to be sure there is no medical reason for her to feel this way. <labs showed BS=214 & Metform500Bid started)>  ~  Sep10:  recent travel to [place] and congested etc- took ZPak & improved... 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 started on Metformin 500Bid after last OV, but not checking BS at home... had eye check from Optometrist w/ report of early retinopathy & rec for 31mo follow up... also had BMD 3/10 which was WNL.. <labs showed BS=194, A1c=7.9 & Glimep2mg  started>   ~  July 31, 2009:  feeling OK & preparing for trip to [place]- had polio booster, has malaria med, wants ZPak... states BS 120-140 range but wt up to 207#... otherw no new complaints or concerns... <labs today improved;  low Vit D- rec 2000 u daily>   ~  January 28, 2010:  31mo ROV- feeling well & preparing for trip to [place] (wants Cipro & ZPak)... no bronchitic symptoms, CXR 3/11 NAD, & voice stable;  Chol controlled on diet alone but LDL not at goal & she will continue diet/ exercise/ etc;  BS improved at home w/ MetformBid + Glimep2 & diet (wt down 4#);  she is seeing Ortho in [place]- on Pred5mg /d x30d & this really helped but warned re her DM control;  she didn't stick w/ the Vit D 2000u daily & asked to restart.   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 she is an ex-smoker and quit  ~2001... seen by DrShoemaker for ENT in 1998.  Hx of BRONCHITIS, CHRONIC (ICD-491.9) - ex-smoker quit  ~2001... she has min cough, no sputum, chr DOE without change, and denies CP, palpit, CHF symptoms, edema, etc...  ~  CXR 3/10 showed clear, NAD.Marland Kitchen. stable, no change 3/11...  MITRAL VALVE PROLAPSE (ICD-424.0) - clinical dx... never had 2DEcho... intermittent msc heard over the years... no CP, palpit, etc...  HYPERCHOLESTEROLEMIA - prev on meds but pt stopped them on her own and is using red yeast rice and OTC fish oil supplements...   ~  FLP 1/08 showed TChol 187, TG 165, HDL 42, LDL 112... she would consider statin therapy is "absolutely necessary" based on her labs, but is more inclined to use diet and supplements...  ~  FLP 1/09 showed TChol 205, TG 132, HDL 46, LDL 141... offered low dose Crestor, she prefers diet.  ~  FLP 3/10 showed TChol 195, TG 134, HDL 38, LDL 130  ~  FLP 3/11 showed TChol 194, TG 201, HDL 50, LDL 126  DIABETES MELLITUS - review of chart show FBS ~120-130 range & HgA1c= low6's for several yrs on diet alone... she is asymptomatic, prev eval  by endocrinologist in [place], [place]...  ~  labs 1/09  (wt=210#) showed FBS= 136, HgA1c= 6.9.Marland KitchenMarland Kitchen rec to start meds but she prefers diet Rx...  ~  labs 3/10 (wt=209#) showed BS= 214, A1c= not done... rec> start METFORMIN 500Bid.  ~  labs 9/10 (wt=200#) showed BS= 194, A1c= 7.9.Marland Kitchen. rec> add GLIMEPIRIDE 2mg /d.  ~  labs 3/11 (wt=207#) showed BS= 160, A1c= 6.9.Marland KitchenMarland Kitchen continue both meds.  ~  labs 9/11 (wt=203#) showed BS= 165, A1c= 6.7  OVERWEIGHT (ICD-278.02) - not really dieting or exercising by her admission... we discussed diet + exercise program--- and we have given her the phone # for [facility] to consider Bariatric options...  GERD (ICD-530.81) - some reflux laryngitis suspected in the  past... without prev EGD... she has had increased heartburn symptoms recently and we discussed starting PRILOSEC 20mg /d taken 30 min before dinner (she uses this Prn only).  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 f/u planned 5 yrs.  VITAMIN D DEFICIENCY (ICD-268.9)  ~  labs 3/11 showed Vit D level = 20... rec> start 2000 u Vit D OTC daily & stay on this.  DYSTHYMIA (ICD-300.4) - hx anxiety and depression in past... followed by [facility] (sees Dr. Jules Schick) and prev on Celexa... prev suicidal gesture w/ tylenol overdose after divorce from her 3rd husb...   ~  3/10:  she notes increased symptoms of depression and will f/u w DrPittman and psychologist Phylliss Blakes...  ~  9/11:  she reports Rx w/ WELLBUTRIN 75mg /d.   Preventive Screening-Counseling & Management  Alcohol-Tobacco     Smoking Status: quit     Year Quit: 1999  Allergies: 1)  ! Metformin Hcl (Metformin Hcl) 2)  ! Lipitor (Atorvastatin Calcium)  Comments:  Nurse/Medical Assistant: The patient's medications and allergies were reviewed with the patient and were updated in the Medication and Allergy Lists.  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 Father died age 57 w/ MI, CABG, prostate cancer Mother died age 47 w/ DM & depression 2 Brothers- Donnell Lambe, our pt w/ hx of Chol, HBP.  Social History: Reviewed history  from 07/31/2008 and no changes required. Divorced 1 daughter= [place], age 24 w/ MS Former smoker.  Quit in 1999.  Smoked 1 ppd x 40 years. 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  The patient denies anorexia, fever, weight loss, weight gain, vision loss, decreased hearing, chest pain, syncope, peripheral edema, prolonged cough, headaches, hemoptysis, abdominal pain, melena, hematochezia, severe indigestion/heartburn, hematuria, incontinence, muscle weakness, suspicious skin lesions, transient blindness, depression, unusual weight change, abnormal bleeding, enlarged lymph nodes, and angioedema.    Vital Signs:  Patient profile:   70 year old female Height:      64 inches Weight:      202.50 pounds BMI:     34.88 O2 Sat:      98 % on Room air Temp:     97.3 degrees F  oral Pulse rate:   104 / minute BP sitting:   132 / 84  (right arm) Cuff size:   regular  Vitals Entered By: [name] CMA (January 28, 2010 2:35 PM)  O2 Sat at Rest %:  98 O2 Flow:  Room air CC: 6 month ROV & review of mult medical problems... Is Patient Diabetic? Yes Pain Assessment Patient in pain? no      Comments meds updated today with pt   Physical Exam  Additional Exam:  WD, Overweight, 70 y/o WF in NAD... GENERAL:  Alert & oriented; pleasant & cooperative. HEENT:  [place]/AT, EOM-wnl, PERRLA, Fundi-benign, EACs-clear, TMs-wnl, NOSE-clear, THROAT-clear & wnl, sl hoarse without change. NECK:  Supple w/ fairROM; no JVD; normal carotid impulses w/o bruits; no thyromegaly or nodules palpated; no lymphadenopathy. CHEST:  Clear without wheezes, rales, or rhonchi heard... HEART:  Regular Rhythm; without murmurs/ rubs/ or gallops detected... ABDOMEN:  Soft & nontender; normal bowel sounds; no organomegaly or masses palpated... EXT: without deformities, mild  arthritic changes; no varicose veins/ venous insuffic/ or edema. NEURO:  CN's intact, no focal neuro deficits... DERM:  neg- without lesions seen, no rash, etc...    MISC. Report  Procedure date:  01/28/2010  Findings:      BMP (METABOL)   Sodium                    142 mEq/L                   135-145   Potassium                 4.1 mEq/L                   3.5-5.1   Chloride                  102 mEq/L                   96-112   Carbon Dioxide            31 mEq/L                    19-32   Glucose              [H]  165 mg/dL                   16-10   BUN                       15 mg/dL                    9-60   Creatinine                0.9 mg/dL                   4.5-4.0   Calcium                   9.5 mg/dL                   9.8-11.9   GFR                       70.47 mL/min                >60  Hemoglobin A1C (A1C)   Hemoglobin A1C       [  H]  6.7 %                       4.6-6.5   Impression & Recommendations:  Problem # 1:  Hx of BRONCHITIS, CHRONIC (ICD-491.9) Ex-smoker & breathing is stable... discussed diet & exercise- get wt down to help DOE...  Problem # 2:  HYPERCHOLESTEROLEMIA (ICD-272.0) Her LDL is not at goal & she doesn't want med Rx>  discussed low chol/ low fat diet & wt reduction...  Problem # 3:  DIABETES MELLITUS (ICD-250.00) Stable on Metform + Glimep... needs better diet & wt reduction to keep from having to incr meds over time & she understands this... Her updated medication list for this problem includes:    Metformin Hcl 500 Mg Tabs (Metformin hcl) .Marland Kitchen... 1 by mouth two times a day    Glimepiride 2 Mg Tabs (Glimepiride) .Marland Kitchen... Take 1 tab by mouth once daily in the am...  Orders: TLB-BMP (Basic Metabolic Panel-BMET) (80048-METABOL) TLB-A1C / Hgb A1C (Glycohemoglobin) (83036-A1C)  Problem # 4:  GERD (ICD-530.81) Rec to take the Prilosec regularly as discussed...  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  Problem # 6:  VITAMIN D DEFICIENCY (ICD-268.9) Rec to stay on the Vit D 2000 u daily...  Problem # 7:  DYSTHYMIA (ICD-300.4) Followed by DrPittman on Wellbutrin per pt hx...  Complete Medication List: 1)  Metformin Hcl 500 Mg Tabs (Metformin hcl) .Marland Kitchen.. 1 by mouth two times a day 2)  Glimepiride 2 Mg Tabs (Glimepiride) .... Take 1 tab by mouth once daily in the am... 3)  Prednisone 5 Mg Tabs (Prednisone) .... Take 1 tablet by mouth once a day x 30 days per drhamilton... 4)  Caltrate 600+d 600-400 Mg-unit Tabs (Calcium carbonate-vitamin d) .... Take 1 tab by mouth once daily.Marland KitchenMarland Kitchen 5)  Womens Multivitamin Plus Tabs (Multiple vitamins-minerals) .... Take 1 tab by mouth once daily.Marland KitchenMarland Kitchen 6)  Vitamin D3 2000 Unit Caps (Cholecalciferol) .... Take 1 cap by mouth once daily.Marland KitchenMarland Kitchen 7)  Wellbutrin 75 Mg Tabs (Bupropion hcl) .... Take 1 tablet by mouth once a day 8)  Zithromax Z-pak 250 Mg Tabs (Azithromycin) .... Take as directed... 9)  Ciprofloxacin Hcl 250 Mg Tabs (Ciprofloxacin hcl) .... Take 1 tab by mouth two times a day as directed...  Patient Instructions: 1)  Today we updated your med list- see below.... 2)  We wrote new perscriptions for ZPak & Cipro per request... 3)  Today we did your follow up DM lab work... please call the "phone tree" in a few days for your lab results.Marland KitchenMarland Kitchen 4)  Have a great time in [place]!!! 5)  Please schedule a follow-up appointment in 6 months, & we will plan FASTING blood work at that time... Prescriptions: CIPROFLOXACIN HCL 250 MG TABS (CIPROFLOXACIN HCL) take 1 tab by mouth two times a day as directed...  #14 x 0   Entered and Authorized by:   Michele Mcalpine MD   Signed by:   Michele Mcalpine MD on 01/28/2010   Method used:   Print then Give to Patient   RxID:   [id number] ZITHROMAX Z-PAK 250 MG TABS (AZITHROMYCIN) take as directed...  #1 x 0   Entered and Authorized by:   Michele Mcalpine MD   Signed by:   Michele Mcalpine MD on 01/28/2010   Method used:    Print then Give to Patient   RxID:   [id number]  Immunization History:  Influenza Immunization History:    Influenza:  historical (01/23/2010)

## 2010-06-18 NOTE — Progress Notes (Signed)
Summary: poison ivy  Phone Note Call from Patient Call back at Home Phone [phone]   Caller: Patient Call For: nadel Summary of Call: Poison ivy since last week, wants a prednisone dose pack call in.//[facility] Initial call taken by: Darletta Moll,  October 25, 2009 9:18 AM  Follow-up for Phone Call        per sn ok for prednisone 5mg  pack 6 day taper take as directed  pt aware rx sent to pharmacy Follow-up by: Philipp Deputy CMA,  October 25, 2009 10:08 AM    New/Updated Medications: PREDNISONE (PAK) 5 MG TABS (PREDNISONE) 6 day pack take as directed Prescriptions: PREDNISONE (PAK) 5 MG TABS (PREDNISONE) 6 day pack take as directed  #1 x 0   Entered by:   Philipp Deputy CMA   Authorized by:   Michele Mcalpine MD   Signed by:   Philipp Deputy CMA on 10/25/2009   Method used:   [facility] to        [facility] [phone]* (retail)       [address]       [place], [place]  [address]       Ph: [phone]       Fax: [phone]   RxID:   [id number]

## 2010-06-18 NOTE — Letter (Signed)
Summary: Clearance/[facility]  Clearance/[facility]   Imported By: Lester Goliad 12/20/2009 09:51:36  _____________________________________________________________________  External Attachment:    Type:   Image     Comment:   External Document

## 2010-06-26 NOTE — Progress Notes (Signed)
Summary: lab appt-pt returned call  Phone Note Call from Patient Call back at Home Phone [phone] Call back at [phone]   Caller: Patient Call For: Victoria Thornton Summary of Call: Pt has appt on 4/25 wants her labs scheduled prior to this. Initial call taken by: Darletta Moll,  June 17, 2010 1:07 PM  Follow-up for Phone Call        Pls advise if okay to order labs prior to pts appt in April Vernie Murders  June 17, 2010 3:04 PM    lmomtcb to find out what day in april pt would like to come in----lip-272.0/bmp-401.9/hepat-790.5/cbcd-285.9/tsh-244.9/ua-595. [name] [facility]  June 17, 2010 4:38 PM   Additional Follow-up for Phone Call Additional follow up Details #1::        pt returned call from [place]. pt says that she already has an appt w/ sn on 4/25 at 3:30. she wants to have labs done the monday before (2 days prior to appt w/ dr Kriste Basque). Tivis Ringer, CNA  June 18, 2010 11:48 AM patient phoned stated that she was retruning a call to Marliss Czar she can be reached at [phone]. Marland KitchenVedia Coffer  June 19, 2010 9:03 AM    Additional Follow-up for Phone Call Additional follow up Details #2::    called to verify the appt date for 4-23 for labs---pt has appt with SN on 4-25.  pt is aware order is in computer fo rher [name] CMA  June 19, 2010 9:33 AM    Appended Document: Orders Update for 09/09/2010     Clinical Lists Changes  Orders: Added new Test order of TLB-Lipid Panel (80061-LIPID) - Signed Added new Test order of TLB-BMP (Basic Metabolic Panel-BMET) (80048-METABOL) - Signed Added new Test order of TLB-Hepatic/Liver Function Pnl (80076-HEPATIC) - Signed Added new Test order of TLB-CBC Platelet - w/Differential (85025-CBCD) - Signed Added new Test order of TLB-TSH (Thyroid Stimulating Hormone) (84443-TSH) - Signed Added new Test order of TLB-Udip ONLY (81003-UDIP) - Signed

## 2010-08-06 ENCOUNTER — Encounter: Payer: Self-pay | Admitting: Pulmonary Disease

## 2010-08-09 DIAGNOSIS — I639 Cerebral infarction, unspecified: Secondary | ICD-10-CM

## 2010-08-09 HISTORY — DX: Cerebral infarction, unspecified: I63.9

## 2010-08-12 ENCOUNTER — Telehealth: Payer: Self-pay | Admitting: Pulmonary Disease

## 2010-08-12 NOTE — Telephone Encounter (Signed)
I called sister in law Janina Mayo & left message for her to call me back... SN

## 2010-08-12 NOTE — Telephone Encounter (Signed)
Spoke with Lynden Ang (pt sister in law) and states pt had a stroke on Friday. Pt will be transferred to [facility] either today or tomorrow and she will be their for 5 weeks. Pt just wanted to inform Dr. Kriste Basque of this and make him aware of what's going on.  Carver Fila, CMA

## 2010-08-13 ENCOUNTER — Inpatient Hospital Stay (HOSPITAL_COMMUNITY)
Admission: RE | Admit: 2010-08-13 | Discharge: 2010-08-26 | DRG: 945 | Disposition: A | Discharge status: Home / Self Care | Payer: Medicare Other | Source: Other Acute Inpatient Hospital | Attending: Physical Medicine & Rehabilitation | Admitting: Physical Medicine & Rehabilitation

## 2010-08-13 DIAGNOSIS — I6350 Cerebral infarction due to unspecified occlusion or stenosis of unspecified cerebral artery: Secondary | ICD-10-CM

## 2010-08-13 DIAGNOSIS — Z5189 Encounter for other specified aftercare: Principal | ICD-10-CM

## 2010-08-13 DIAGNOSIS — G5790 Unspecified mononeuropathy of unspecified lower limb: Secondary | ICD-10-CM

## 2010-08-13 DIAGNOSIS — Z823 Family history of stroke: Secondary | ICD-10-CM

## 2010-08-13 DIAGNOSIS — E785 Hyperlipidemia, unspecified: Secondary | ICD-10-CM

## 2010-08-13 DIAGNOSIS — Z8249 Family history of ischemic heart disease and other diseases of the circulatory system: Secondary | ICD-10-CM

## 2010-08-13 DIAGNOSIS — R471 Dysarthria and anarthria: Secondary | ICD-10-CM

## 2010-08-13 DIAGNOSIS — F4321 Adjustment disorder with depressed mood: Secondary | ICD-10-CM

## 2010-08-13 DIAGNOSIS — I10 Essential (primary) hypertension: Secondary | ICD-10-CM

## 2010-08-13 DIAGNOSIS — R29810 Facial weakness: Secondary | ICD-10-CM

## 2010-08-13 DIAGNOSIS — I6330 Cerebral infarction due to thrombosis of unspecified cerebral artery: Secondary | ICD-10-CM

## 2010-08-13 DIAGNOSIS — R1310 Dysphagia, unspecified: Secondary | ICD-10-CM

## 2010-08-13 DIAGNOSIS — Z87891 Personal history of nicotine dependence: Secondary | ICD-10-CM

## 2010-08-13 DIAGNOSIS — G8190 Hemiplegia, unspecified affecting unspecified side: Secondary | ICD-10-CM

## 2010-08-13 DIAGNOSIS — G4700 Insomnia, unspecified: Secondary | ICD-10-CM

## 2010-08-13 DIAGNOSIS — R209 Unspecified disturbances of skin sensation: Secondary | ICD-10-CM

## 2010-08-13 DIAGNOSIS — E1169 Type 2 diabetes mellitus with other specified complication: Secondary | ICD-10-CM

## 2010-08-13 LAB — GLUCOSE, CAPILLARY
Glucose-Capillary: 175 mg/dL — ABNORMAL HIGH (ref 70–99)
Glucose-Capillary: 68 mg/dL — ABNORMAL LOW (ref 70–99)

## 2010-08-14 ENCOUNTER — Telehealth: Payer: Self-pay | Admitting: Pulmonary Disease

## 2010-08-14 DIAGNOSIS — G8110 Spastic hemiplegia affecting unspecified side: Secondary | ICD-10-CM

## 2010-08-14 DIAGNOSIS — I6330 Cerebral infarction due to thrombosis of unspecified cerebral artery: Secondary | ICD-10-CM

## 2010-08-14 DIAGNOSIS — Z5189 Encounter for other specified aftercare: Secondary | ICD-10-CM

## 2010-08-14 LAB — CBC
HCT: 37.5 % (ref 36.0–46.0)
Hemoglobin: 12.5 g/dL (ref 12.0–15.0)
MCH: 29.3 pg (ref 26.0–34.0)
MCHC: 33.3 g/dL (ref 30.0–36.0)
RDW: 13 % (ref 11.5–15.5)

## 2010-08-14 LAB — GLUCOSE, CAPILLARY
Glucose-Capillary: 152 mg/dL — ABNORMAL HIGH (ref 70–99)
Glucose-Capillary: 115 mg/dL — ABNORMAL HIGH (ref 70–99)

## 2010-08-14 LAB — COMPREHENSIVE METABOLIC PANEL
BUN: 11 mg/dL (ref 6–23)
CO2: 28 mEq/L (ref 19–32)
Calcium: 9 mg/dL (ref 8.4–10.5)
Creatinine, Ser: 0.92 mg/dL (ref 0.4–1.2)
GFR calc non Af Amer: >60 mL/min (ref >60)
Glucose, Bld: 153 mg/dL — ABNORMAL HIGH (ref 70–99)
Total Protein: 7.2 g/dL (ref 6.0–8.3)

## 2010-08-14 LAB — DIFFERENTIAL
Eosinophils Relative: 3 % (ref 0–5)
Lymphocytes Relative: 31 % (ref 12–46)
Monocytes Absolute: 0.7 10*3/uL (ref 0.1–1.0)
Monocytes Relative: 7 % (ref 3–12)
Neutro Abs: 6.3 10*3/uL (ref 1.7–7.7)

## 2010-08-14 NOTE — Telephone Encounter (Signed)
Will forward message to SN to make him aware and that pt is requesting to see him.

## 2010-08-15 DIAGNOSIS — Z5189 Encounter for other specified aftercare: Secondary | ICD-10-CM

## 2010-08-15 DIAGNOSIS — I6330 Cerebral infarction due to thrombosis of unspecified cerebral artery: Secondary | ICD-10-CM

## 2010-08-15 DIAGNOSIS — G8110 Spastic hemiplegia affecting unspecified side: Secondary | ICD-10-CM

## 2010-08-15 LAB — GLUCOSE, CAPILLARY
Glucose-Capillary: 102 mg/dL — ABNORMAL HIGH (ref 70–99)
Glucose-Capillary: 87 mg/dL (ref 70–99)
Glucose-Capillary: 91 mg/dL (ref 70–99)

## 2010-08-16 DIAGNOSIS — I6330 Cerebral infarction due to thrombosis of unspecified cerebral artery: Secondary | ICD-10-CM

## 2010-08-16 DIAGNOSIS — G8110 Spastic hemiplegia affecting unspecified side: Secondary | ICD-10-CM

## 2010-08-16 DIAGNOSIS — Z5189 Encounter for other specified aftercare: Secondary | ICD-10-CM

## 2010-08-16 LAB — GLUCOSE, CAPILLARY: Glucose-Capillary: 121 mg/dL — ABNORMAL HIGH (ref 70–99)

## 2010-08-17 LAB — GLUCOSE, CAPILLARY: Glucose-Capillary: 113 mg/dL — ABNORMAL HIGH (ref 70–99)

## 2010-08-18 LAB — GLUCOSE, CAPILLARY
Glucose-Capillary: 96 mg/dL (ref 70–99)
Glucose-Capillary: 108 mg/dL — ABNORMAL HIGH (ref 70–99)
Glucose-Capillary: 109 mg/dL — ABNORMAL HIGH (ref 70–99)
Glucose-Capillary: 82 mg/dL (ref 70–99)

## 2010-08-19 DIAGNOSIS — F4321 Adjustment disorder with depressed mood: Secondary | ICD-10-CM

## 2010-08-19 DIAGNOSIS — G8110 Spastic hemiplegia affecting unspecified side: Secondary | ICD-10-CM

## 2010-08-19 DIAGNOSIS — E119 Type 2 diabetes mellitus without complications: Secondary | ICD-10-CM

## 2010-08-19 DIAGNOSIS — I6330 Cerebral infarction due to thrombosis of unspecified cerebral artery: Secondary | ICD-10-CM

## 2010-08-19 DIAGNOSIS — Z5189 Encounter for other specified aftercare: Secondary | ICD-10-CM

## 2010-08-19 DIAGNOSIS — I669 Occlusion and stenosis of unspecified cerebral artery: Secondary | ICD-10-CM

## 2010-08-19 LAB — GLUCOSE, CAPILLARY
Glucose-Capillary: 148 mg/dL — ABNORMAL HIGH (ref 70–99)
Glucose-Capillary: 115 mg/dL — ABNORMAL HIGH (ref 70–99)

## 2010-08-20 LAB — GLUCOSE, CAPILLARY: Glucose-Capillary: 91 mg/dL (ref 70–99)

## 2010-08-21 LAB — GLUCOSE, CAPILLARY
Glucose-Capillary: 164 mg/dL — ABNORMAL HIGH (ref 70–99)
Glucose-Capillary: 97 mg/dL (ref 70–99)

## 2010-08-22 LAB — GLUCOSE, CAPILLARY
Glucose-Capillary: 129 mg/dL — ABNORMAL HIGH (ref 70–99)
Glucose-Capillary: 192 mg/dL — ABNORMAL HIGH (ref 70–99)
Glucose-Capillary: 79 mg/dL (ref 70–99)

## 2010-08-23 LAB — GLUCOSE, CAPILLARY
Glucose-Capillary: 95 mg/dL (ref 70–99)
Glucose-Capillary: 123 mg/dL — ABNORMAL HIGH (ref 70–99)

## 2010-08-25 LAB — GLUCOSE, CAPILLARY
Glucose-Capillary: 136 mg/dL — ABNORMAL HIGH (ref 70–99)
Glucose-Capillary: 115 mg/dL — ABNORMAL HIGH (ref 70–99)
Glucose-Capillary: 155 mg/dL — ABNORMAL HIGH (ref 70–99)

## 2010-08-27 ENCOUNTER — Telehealth: Payer: Self-pay | Admitting: Pulmonary Disease

## 2010-08-27 DIAGNOSIS — R531 Weakness: Secondary | ICD-10-CM

## 2010-08-27 DIAGNOSIS — Z0000 Encounter for general adult medical examination without abnormal findings: Secondary | ICD-10-CM | POA: Insufficient documentation

## 2010-08-27 DIAGNOSIS — E119 Type 2 diabetes mellitus without complications: Secondary | ICD-10-CM

## 2010-08-27 DIAGNOSIS — E7800 Pure hypercholesterolemia, unspecified: Secondary | ICD-10-CM

## 2010-08-27 NOTE — Telephone Encounter (Signed)
Patient called back wants to know if she needs to go to the lab tomorrow or Thursday so he will have her lab results at her Friday appointment. She can be reached at [phone].Vedia Coffer

## 2010-08-27 NOTE — Telephone Encounter (Signed)
Called and spoke with pt and she is aware that it will be ok to come for fasting labs on wed and she has her ov with SN on Friday.

## 2010-08-27 NOTE — Telephone Encounter (Signed)
Pls advise on labs.  

## 2010-08-27 NOTE — Telephone Encounter (Signed)
See other phone note about labs

## 2010-08-27 NOTE — Telephone Encounter (Signed)
Please advise on what labs pt will need. Thanks Dr. Lynnette Caffey, CMA

## 2010-08-28 ENCOUNTER — Other Ambulatory Visit (INDEPENDENT_AMBULATORY_CARE_PROVIDER_SITE_OTHER): Payer: Medicare Other

## 2010-08-28 ENCOUNTER — Encounter: Payer: Self-pay | Admitting: Pulmonary Disease

## 2010-08-28 ENCOUNTER — Other Ambulatory Visit (INDEPENDENT_AMBULATORY_CARE_PROVIDER_SITE_OTHER): Payer: Medicare Other | Admitting: Pulmonary Disease

## 2010-08-28 DIAGNOSIS — E7800 Pure hypercholesterolemia, unspecified: Secondary | ICD-10-CM

## 2010-08-28 DIAGNOSIS — Z0000 Encounter for general adult medical examination without abnormal findings: Secondary | ICD-10-CM

## 2010-08-28 DIAGNOSIS — E785 Hyperlipidemia, unspecified: Secondary | ICD-10-CM

## 2010-08-28 DIAGNOSIS — R5383 Other fatigue: Secondary | ICD-10-CM

## 2010-08-28 DIAGNOSIS — R5381 Other malaise: Secondary | ICD-10-CM

## 2010-08-28 DIAGNOSIS — R531 Weakness: Secondary | ICD-10-CM

## 2010-08-28 LAB — URINALYSIS
Bilirubin Urine: NEGATIVE
Leukocytes, UA: NEGATIVE
Specific Gravity, Urine: >=1.03 (ref 1.000–1.030)
Total Protein, Urine: NEGATIVE
Urine Glucose: NEGATIVE
pH: 5.5 (ref 5.0–8.0)

## 2010-08-28 LAB — BASIC METABOLIC PANEL
BUN: 19 mg/dL (ref 6–23)
Calcium: 9.6 mg/dL (ref 8.4–10.5)
GFR: 70.35 mL/min (ref >60.00)
Glucose, Bld: 101 mg/dL — ABNORMAL HIGH (ref 70–99)
Potassium: 4.5 mEq/L (ref 3.5–5.1)
Sodium: 140 mEq/L (ref 135–145)

## 2010-08-28 LAB — HEPATIC FUNCTION PANEL
AST: 20 U/L (ref 0–37)
Albumin: 4.3 g/dL (ref 3.5–5.2)
Total Bilirubin: 0.7 mg/dL (ref 0.3–1.2)

## 2010-08-28 LAB — CBC WITH DIFFERENTIAL/PLATELET
Basophils Relative: 0.2 % (ref 0.0–3.0)
Eosinophils Relative: 2 % (ref 0.0–5.0)
HCT: 40.3 % (ref 36.0–46.0)
Hemoglobin: 13.9 g/dL (ref 12.0–15.0)
Lymphs Abs: 2.7 10*3/uL (ref 0.7–4.0)
MCV: 86.8 fl (ref 78.0–100.0)
Monocytes Absolute: 0.6 10*3/uL (ref 0.1–1.0)
Monocytes Relative: 6.3 % (ref 3.0–12.0)
Neutro Abs: 6.5 10*3/uL (ref 1.4–7.7)
Platelets: 293 10*3/uL (ref 150.0–400.0)
WBC: 10.1 10*3/uL (ref 4.5–10.5)

## 2010-08-28 LAB — LIPID PANEL
HDL: 44.4 mg/dL (ref >39.00)
Triglycerides: 290 mg/dL — ABNORMAL HIGH (ref 0.0–149.0)
VLDL: 58 mg/dL — ABNORMAL HIGH (ref 0.0–40.0)

## 2010-08-29 ENCOUNTER — Ambulatory Visit: Payer: Medicare Other | Admitting: Physical Therapy

## 2010-08-30 ENCOUNTER — Encounter: Payer: Medicare Other | Admitting: Occupational Therapy

## 2010-08-30 ENCOUNTER — Ambulatory Visit: Payer: Medicare Other | Attending: Physical Medicine & Rehabilitation

## 2010-08-30 ENCOUNTER — Encounter: Payer: Self-pay | Admitting: Pulmonary Disease

## 2010-08-30 ENCOUNTER — Ambulatory Visit (INDEPENDENT_AMBULATORY_CARE_PROVIDER_SITE_OTHER): Payer: Medicare Other | Admitting: Pulmonary Disease

## 2010-08-30 ENCOUNTER — Ambulatory Visit: Payer: Medicare Other | Admitting: Physical Therapy

## 2010-08-30 ENCOUNTER — Ambulatory Visit: Payer: Medicare Other | Admitting: Pulmonary Disease

## 2010-08-30 VITALS — BP 124/62 | HR 67 | Temp 96.8°F | Ht 64.0 in | Wt 197.4 lb

## 2010-08-30 DIAGNOSIS — J42 Unspecified chronic bronchitis: Secondary | ICD-10-CM

## 2010-08-30 DIAGNOSIS — R279 Unspecified lack of coordination: Secondary | ICD-10-CM | POA: Insufficient documentation

## 2010-08-30 DIAGNOSIS — E119 Type 2 diabetes mellitus without complications: Secondary | ICD-10-CM

## 2010-08-30 DIAGNOSIS — I639 Cerebral infarction, unspecified: Secondary | ICD-10-CM

## 2010-08-30 DIAGNOSIS — I6350 Cerebral infarction due to unspecified occlusion or stenosis of unspecified cerebral artery: Secondary | ICD-10-CM

## 2010-08-30 DIAGNOSIS — Z5189 Encounter for other specified aftercare: Secondary | ICD-10-CM | POA: Insufficient documentation

## 2010-08-30 DIAGNOSIS — I69922 Dysarthria following unspecified cerebrovascular disease: Secondary | ICD-10-CM | POA: Insufficient documentation

## 2010-08-30 DIAGNOSIS — I059 Rheumatic mitral valve disease, unspecified: Secondary | ICD-10-CM

## 2010-08-30 DIAGNOSIS — E7800 Pure hypercholesterolemia, unspecified: Secondary | ICD-10-CM

## 2010-08-30 DIAGNOSIS — I69998 Other sequelae following unspecified cerebrovascular disease: Secondary | ICD-10-CM | POA: Insufficient documentation

## 2010-08-30 DIAGNOSIS — M6281 Muscle weakness (generalized): Secondary | ICD-10-CM | POA: Insufficient documentation

## 2010-08-30 DIAGNOSIS — R269 Unspecified abnormalities of gait and mobility: Secondary | ICD-10-CM | POA: Insufficient documentation

## 2010-08-30 DIAGNOSIS — F341 Dysthymic disorder: Secondary | ICD-10-CM

## 2010-08-30 MED ORDER — LISINOPRIL 10 MG PO TABS: 10.0000 mg | ORAL_TABLET | Freq: Every day | ORAL | Status: AC

## 2010-08-30 MED ORDER — SIMVASTATIN 20 MG PO TABS: 20.0000 mg | ORAL_TABLET | Freq: Every day | ORAL | Status: DC

## 2010-08-30 MED ORDER — ASPIRIN-DIPYRIDAMOLE ER 25-200 MG PO CP12: 1.0000 | ORAL_CAPSULE | Freq: Two times a day (BID) | ORAL | Status: DC

## 2010-08-30 MED ORDER — ASPIRIN-DIPYRIDAMOLE ER 25-200 MG PO CP12: 1.0000 | ORAL_CAPSULE | Freq: Two times a day (BID) | ORAL | Status: AC

## 2010-08-30 MED ORDER — GABAPENTIN 400 MG PO CAPS: 400.0000 mg | ORAL_CAPSULE | Freq: Four times a day (QID) | ORAL | Status: DC

## 2010-08-30 MED ORDER — TRAMADOL HCL 50 MG PO TABS: 50.0000 mg | ORAL_TABLET | Freq: Two times a day (BID) | ORAL | Status: DC | PRN

## 2010-08-30 MED ORDER — ZOLPIDEM TARTRATE 10 MG PO TABS: 5.0000 mg | ORAL_TABLET | Freq: Every evening | ORAL | Status: DC | PRN

## 2010-08-30 NOTE — Patient Instructions (Signed)
Today we updated your med list in our EPIC system...    For your stroke:  Continue the Aggrenox twice daily...    For the pain in your thigh area: continue the Neurontin 4 times dailt, & use the Tramadol as needed...    For your Cholesterol: continue the Simvastatin 20mg  daily, & get on a much better LOW FAT diet...    For your DM: you need to restrict CARBS & lose weight; continue the Metformin twice daily & stop the Glimepiride...     Today we reviewed your recent lab work & we gave you a copy for your records...  We will send off for the records from [facility] in [place], [place]...  We will arrange for a consultation w/ the vascular surgeons to check your Carotid Arteries & establish a baseline so they can closely follow your vascular problems (and determine if & when surgery might be needed)...  Call for any questions... Let's plan a follow up visit it one month.Marland KitchenMarland Kitchen

## 2010-08-30 NOTE — Progress Notes (Signed)
Subjective:    Patient ID: Victoria Thornton, female    DOB: [birth year], 70 y.o.   MRN: [id number]  HPI 70 y/o WF here for a follow up visit... she lives in [place], [place] but still maintains her medical care here in [place];  She has mult medical problems including:  Hx VC polyp & chr hoarseness;  Chronic Bronchitis/ ex-smoker;  MVP;  Hyperchol;  DM;  Overweight;  GERD;  Divertics/ Colon Polyps;  STROKE;  Vit D defic;  Anxiety & Depression...  ~  January 28, 2010:  70mo ROV- feeling well & preparing for trip to [place] (wants Cipro & ZPak)... no bronchitic symptoms, CXR 3/11 NAD, & voice stable;  Chol controlled on diet alone but LDL not at goal & she will continue diet/ exercise/ etc;  BS improved at home w/ MetformBid + Glimep2 & diet (wt down 4#);  she is seeing Ortho in [place]- on Pred5mg /d x30d & this really helped but warned re her DM control;  she didn't stick w/ the Vit D 2000u daily & asked to restart.  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 & was adm to [facility] in [place], [place];  We don't have their direct work-up data yet but [facility] & I have reviewed all the [facility] data & DrKirsteins summary:  Her initial MRI showed an acute infarct in the post limb of the left internal capsule;  There was a report of an abn Carotid doppler as well (50-79% RICA &16-49% LICA stenoses);  She apparently did not receive Neurology or VascSurg consults;  Several med adjustments made> she agreed to start Simva20, they stopped her Glimepiride, added Aggrenox, added low dose Lisinopril, added Neurontin for a painful right sided neuropathy...      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 she is an ex-smoker and quit ~2001... seen by DrShoemaker for ENT in 1998. ~  4/12:  She had a left brain stroke 3/12 w/ mild aphasia & some dysphonia;  She passed the swallowing  tests in rehab...  Hx of BRONCHITIS, CHRONIC (ICD-491.9) - ex-smoker quit ~2001... she has min cough, no sputum, chr DOE without change, and denies CP, palpit, CHF symptoms, edema, etc... ~  CXR 3/10 showed clear, NAD...  F/u CXR 3/11 is stable, no change... ~  Awaiting records from [facility] 3/12 ==>   MITRAL VALVE PROLAPSE (ICD-424.0) - clinical dx... never had 2DEcho... intermittent msc heard over the years... no CP, palpit, etc... ~  Awaiting records from [facility] 3/12 ==>   HYPERCHOLESTEROLEMIA - prev on meds but pt stopped them on her own and is using red yeast rice and OTC fish oil supplements...  ~  FLP 1/08 showed TChol 187, TG 165, HDL 42, LDL 112... Discussed low chol, low fat diets. ~  FLP 1/09 showed TChol 205, TG 132, HDL 46, LDL 141... offered low dose Crestor, she prefers diet. ~  FLP 3/10 showed TChol 195, TG 134, HDL 38, LDL 130 ~  FLP 3/11 showed TChol 194, TG 201, HDL 50, LDL 126 ~  3/12:  Placed on SIMVASTATIN 20mg /d after her stroke... ~  FLP 4/12 on Simva20 (but not really fasting, she says) showed TChol 136, TG 290, HDL 44, LDL 58  DIABETES MELLITUS - review of chart show FBS~120-130 range & HgA1c= low6's for several yrs  on diet alone...  ~  she is asymptomatic, prev eval by Endocrinologist in [place], [place]... ~  labs 1/09  (wt=210#) showed FBS= 136, HgA1c= 6.9.Marland KitchenMarland Kitchen rec to start meds but she prefers diet Rx... ~  labs 3/10 (wt=209#) showed BS= 214, A1c= not done... rec> start METFORMIN 500Bid. ~  labs 9/10 (wt=200#) showed BS= 194, A1c= 7.9.Marland Kitchen. rec> add GLIMEPIRIDE 2mg /d. ~  labs 3/11 (wt=207#) showed BS= 160, A1c= 6.9.Marland KitchenMarland Kitchen continue both meds. ~  labs 9/11 (wt=203#) showed BS= 165, A1c= 6.7.Marland KitchenMarland Kitchen Stable on Metform+Glimep. ~  3/12:  Hosp w/ Stroke & sent to Rehab at [facility]> Glimep stopped & Metform 500Bid continued. ~  Labs 4/12 showed BS= 101 on MetforminBid, continue same.  OVERWEIGHT (ICD-278.02) - not really dieting or exercising by her  admission... we discussed diet + exercise program...  GERD (ICD-530.81) - some reflux laryngitis suspected in the past... without prev EGD... she has had increased heartburn symptoms recently and we discussed starting OTC PRILOSEC 20mg /d taken 30 min before dinner (she uses this Prn only).  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 f/u planned 5 yrs.  STROKE > 70/23/12 presented to [facility] in [place], [place] w/ right sided weakness, right facial droop, difficulty walking, & aphasic;  Dx w/ infarct in post limb of the left internal capsule on MRI, & abnormal CDopplers ==> we have requested the hard copy data for our review... ~  4/12:  In light of the abn CDopplers as reported by [facility]> we are referring her to [facility] to repeat studies & establish baseline...  VITAMIN D DEFICIENCY (ICD-268.9) ~  labs 3/11 showed Vit D level = 20... rec> start 2000 u Vit D OTC daily & stay on this.  DYSTHYMIA (ICD-300.4) - hx anxiety and depression in past... followed by [facility] (sees Dr. Jules Schick) and prev on Celexa... prev suicidal gesture w/ Tylenol overdose after divorce from her 3rd husb...  ~  3/10:  she notes increased symptoms of depression and will f/u w DrPittman and psychologist Victoria Thornton... ~  9/11:  she reports now on Rx w/ WELLBUTRIN 75mg /d.   Past Surgical History  Procedure Date  . Appendectomy   . Tonsillectomy     Outpatient Encounter Prescriptions as of 08/30/2010  Medication Sig Dispense Refill  . acetaminophen (TYLENOL) 325 MG tablet Take 650 mg by mouth every 4 (four) hours as needed.        Marland Kitchen buPROPion (WELLBUTRIN) 75 MG tablet Take 150 mg by mouth daily.       Marland Kitchen dipyridamole-aspirin (AGGRENOX) 25-200 MG per 12 hr capsule Take 1 capsule by mouth 2 (two) times daily.  180 capsule  3  . gabapentin (NEURONTIN) 400 MG capsule Take 1 capsule (400 mg total) by mouth 4  (four) times daily.  360 capsule  3  . lisinopril (PRINIVIL,ZESTRIL) 10 MG tablet Take 1 tablet (10 mg total) by mouth daily.  90 tablet  3  . metFORMIN (GLUCOPHAGE) 500 MG tablet Take 500 mg by mouth 2 (two) times daily with a meal.        . simvastatin (ZOCOR) 20 MG tablet Take 1 tablet (20 mg total) by mouth at bedtime.  90 tablet  3  . traMADol (ULTRAM) 50 MG tablet Take 1 tablet (50 mg total) by mouth 2 (two) times daily as needed for pain. Take one By mouth twice daily as needed  100 tablet  2  .  zolpidem (AMBIEN) 10 MG tablet Take 0.5 tablets (5 mg total) by mouth at bedtime as needed.  30 tablet  2  . Calcium Carbonate-Vitamin D (CALTRATE 600+D) 600-400 MG-UNIT per tablet Take 1 tablet by mouth daily.        . Cholecalciferol (VITAMIN D3) 2000 UNITS TABS Take 1 capsule by mouth daily.        . Multiple Vitamins-Minerals (WOMENS DAILY FORMULA PO) Take 1 capsule by mouth daily.          Allergies  Allergen Reactions  . Atorvastatin     REACTION: pt just refuses to take Lipitor    Review of Systems    Constitutional:  Denies F/C/S, anorexia, unexpected weight change. HEENT:  No HA, visual changes, earache, nasal symptoms, sore throat; +hoarseness chronically. Resp:  No cough, sputum, hemoptysis; no SOB, tightness, wheezing. Cardio:  No CP, palpit, orthopnea, edema;   +DOE & starting exerc program. GI:  Denies N/V/D/C or blood in stool; no reflux, abd pain, distention, or gas. GU:  No dysuria, freq, urgency, hematuria, or flank pain. MS:  Denies joint pain, swelling, tenderness; no neck pain, back pain, etc. Neuro:  No tremors, seizures, dizziness, syncope; +weakness, +paresthesias & abn gait. Skin:  No suspicious lesions or skin rash. Heme:  No adenopathy, bruising, bleeding. Psyche: Denies confusion, sleep disturbance, hallucinations;  +anxiety, +depression.    Objective:   Physical Exam     WD, Overweight, 70 y/o WF in NAD... Mild facial asymmetry noted, mild  dysarthria. GENERAL:  Alert & oriented; pleasant & cooperative...  VS reviewed. HEENT:  [place]/AT, EOM-wnl, PERRLA, Fundi-benign, EACs-clear, TMs-wnl, NOSE-clear, THROAT-clear & wnl, sl hoarse without change. NECK:  Supple w/ fairROM; no JVD; normal carotid impulses w/o bruits; no thyromegaly or nodules palpated; no lymphadenopathy. CHEST:  Clear without wheezes, rales, or rhonchi heard... HEART:  Regular Rhythm; without murmurs/ rubs/ or gallops detected... ABDOMEN:  Soft & nontender; normal bowel sounds; no organomegaly or masses palpated... EXT: without deformities, mild arthritic changes; no varicose veins/ venous insuffic/ or edema. NEURO: mild right central facial paresis; mild right sided weakness & min incoordination... DERM:  neg- without lesions seen, no rash, etc...   Assessment & Plan:   STROKE>  She had a left brain infarct w/ some mild residual deficits;  She is to continue outpt rehab;  Continue Aggrenox Bid;  She needs good baseline CDopplers & re-assessment of her carotid status so we are referring her to drEarly, [facility] for repeat studies (our baseline here) & on going follow up...  CHRONIC BRONCHITIC, ex-smoke>  Stable, doing satis, no additional Rx needed...  MVP>  Hx clinical MVP, & not really symptomatic previously;  We did not have baseline 2DEcho on her;  ?if 2DEcho done at [facility] & we have sent for the hard-copy records to review;  If not done prev then we will do in follow up...  CHOL>  She was started on Simv20 w/ her stroke;  Recent FLP was not actually fasting & Chol numbers looked good but TG elev- she wants to get on diet & get wt down!  DM>  Glimep was stopped due to low sugars w/ hosp diet & poor intake after her stroke;  On Metformin 500Bid alone now & BS= 101, continue same & we are following...  Dysthymia>  She has hx anxiety & depression;  Followed by [facility] on Wellbutrin & Ambien for rest, continue same.Marland KitchenMarland Kitchen

## 2010-08-31 ENCOUNTER — Encounter: Payer: Self-pay | Admitting: Pulmonary Disease

## 2010-09-02 ENCOUNTER — Telehealth: Payer: Self-pay | Admitting: Pulmonary Disease

## 2010-09-02 ENCOUNTER — Ambulatory Visit: Payer: Medicare Other | Admitting: Rehabilitative and Restorative Service Providers"

## 2010-09-02 ENCOUNTER — Ambulatory Visit: Payer: Medicare Other | Admitting: Occupational Therapy

## 2010-09-02 MED ORDER — HYDROCODONE-ACETAMINOPHEN 5-500 MG PO TABS: 1.0000 | ORAL_TABLET | Freq: Three times a day (TID) | ORAL | Status: AC | PRN

## 2010-09-02 NOTE — Telephone Encounter (Signed)
Error/dup message ° °

## 2010-09-02 NOTE — Telephone Encounter (Signed)
Per SN--ok to call in vicodin 5/500   #100  Refill x 2  Take one tablet by mouth three times daily as needed for pain.  thanks

## 2010-09-02 NOTE — Telephone Encounter (Signed)
Pt says the Tramadol doesn't help at all with her leg/hip pain. She is requesting something different be called in. Pls advise.

## 2010-09-02 NOTE — Telephone Encounter (Signed)
LMOMTCB.  Per previous phone note, rx was already sent to pharmacy and pt was aware. Unsure why we would have called her again. Triage stated there was nothing further needed and they didn't call her.

## 2010-09-02 NOTE — Telephone Encounter (Signed)
Rx called into [facility] -- pt aware.

## 2010-09-03 ENCOUNTER — Telehealth: Payer: Self-pay | Admitting: Pulmonary Disease

## 2010-09-03 NOTE — Telephone Encounter (Signed)
Forwarded to Dr. Nadel for review. °

## 2010-09-03 NOTE — Telephone Encounter (Signed)
lmomin detail to let pt know rx at pharmacy. She does not need to call us back unless she has further questions.

## 2010-09-04 ENCOUNTER — Telehealth: Payer: Self-pay | Admitting: Pulmonary Disease

## 2010-09-04 NOTE — Telephone Encounter (Signed)
Shanda Bumps returning [place] call from this morning.

## 2010-09-04 NOTE — Telephone Encounter (Signed)
Called and spoke with pt and she stated that she picked up her vicodin today and they gave her hydrocodone--pt was concerned that this was not vicodin.  Explained to pt that this was the correct meds.  She is ok with this and will take this med.

## 2010-09-04 NOTE — Telephone Encounter (Signed)
Called and spoke with [place].  She is aware that SN will call to set up for bridge to lovenox while off the aggrenox x 3 days.  Shanda Bumps will call with further questions or concerns

## 2010-09-05 ENCOUNTER — Encounter: Payer: Medicare Other | Admitting: Physical Medicine & Rehabilitation

## 2010-09-05 ENCOUNTER — Encounter: Payer: Medicare Other | Attending: Physical Medicine & Rehabilitation

## 2010-09-05 DIAGNOSIS — G5710 Meralgia paresthetica, unspecified lower limb: Secondary | ICD-10-CM

## 2010-09-05 NOTE — Procedures (Signed)
Victoria Thornton, Victoria Thornton             ACCOUNT NO.:  [id number]  MEDICAL RECORD NO.:  [id number]           PATIENT TYPE:  O  LOCATION:  [facility]                          FACILITY:  [facility]  PHYSICIAN:  Erick Colace, M.D.DATE OF BIRTH:  08/23/1940  DATE OF PROCEDURE:  09/05/2010 DATE OF DISCHARGE:                              OPERATIVE REPORT  PROCEDURE:  Right lateral femoral cutaneous nerve injection under ultrasound guidance.  INDICATION:  Neuralgia paresthetica, she has been treated for this in the hospital as part of a stroke rehab, maximized gabapentin, other conservative care without improvement.  She continues to complain of severe pain in that lateral thigh area.  Informed consent was obtained after describing risks and benefits of the procedure with the patient.  These include bleeding, bruising, and infection.  She elects to proceed and has given written consent.  The patient placed in a supine position on exam table.  Ultrasound prescreen nerve identified 1 cm depth approximately 1 cm medial to the ASIS of 1- cm inferior to the ASIS.  EchoBlock needle was inserted after chlorhexidine prep using sterile gel, sterile probe cover, sterile technique.  Once needle approximated nerve, superior surface infiltrated 1 mL of 40 mg/mL Depo-Medrol and 4 mL of 0.25% Marcaine.  The patient tolerated procedure well.  Postprocedure instructions given.     Erick Colace, M.D. Electronically Signed    AEK/MEDQ  D:  09/05/2010 09:59:49  T:  09/05/2010 22:15:45  Job:  [id number]

## 2010-09-09 ENCOUNTER — Other Ambulatory Visit: Payer: Self-pay | Admitting: Pulmonary Disease

## 2010-09-09 ENCOUNTER — Other Ambulatory Visit: Payer: Self-pay

## 2010-09-09 DIAGNOSIS — E7800 Pure hypercholesterolemia, unspecified: Secondary | ICD-10-CM

## 2010-09-09 DIAGNOSIS — R748 Abnormal levels of other serum enzymes: Secondary | ICD-10-CM

## 2010-09-09 DIAGNOSIS — I10 Essential (primary) hypertension: Secondary | ICD-10-CM

## 2010-09-09 DIAGNOSIS — D649 Anemia, unspecified: Secondary | ICD-10-CM

## 2010-09-09 DIAGNOSIS — E039 Hypothyroidism, unspecified: Secondary | ICD-10-CM

## 2010-09-10 ENCOUNTER — Ambulatory Visit: Payer: Medicare Other | Admitting: Rehabilitative and Restorative Service Providers"

## 2010-09-11 ENCOUNTER — Ambulatory Visit: Payer: Medicare Other | Admitting: Occupational Therapy

## 2010-09-11 ENCOUNTER — Encounter: Payer: Self-pay | Admitting: Pulmonary Disease

## 2010-09-11 ENCOUNTER — Ambulatory Visit: Payer: Medicare Other

## 2010-09-12 ENCOUNTER — Ambulatory Visit: Payer: Medicare Other | Admitting: Rehabilitative and Restorative Service Providers"

## 2010-09-12 ENCOUNTER — Ambulatory Visit: Payer: Medicare Other | Admitting: Occupational Therapy

## 2010-09-12 ENCOUNTER — Telehealth: Payer: Self-pay | Admitting: Pulmonary Disease

## 2010-09-12 NOTE — Telephone Encounter (Signed)
lmomtcb x1 

## 2010-09-12 NOTE — Telephone Encounter (Signed)
Per last phone note on 09-04-10 was a call from [place] and i explained to her that we will set pt up at coumadin clinic so they can dose her bridge with lovenox.  SN aware that biopsy is set for 09-23-10.  thanks

## 2010-09-12 NOTE — Telephone Encounter (Signed)
Shanda Bumps states pt is scheduled for right breast biopsy 09/23/10. Wants to know if pt can go off of all blood thinners 3 days before and after surgery. Please advise. Thanks.

## 2010-09-12 NOTE — Telephone Encounter (Signed)
Victoria Thornton with [facility] aware we will take care of getting the pt bridged on Lovenox and per Leigh, Dr. Kriste Basque has already set this up with Cicero Duck at [facility] and the pt is aware also.

## 2010-09-13 NOTE — Discharge Summary (Signed)
Victoria Thornton, Victoria Thornton             ACCOUNT NO.:  [id number]  MEDICAL RECORD NO.:  [id number]           PATIENT TYPE:  I  LOCATION:  4142                         FACILITY:  [facility]  PHYSICIAN:  Erick Colace, M.D.DATE OF BIRTH:  [birth year]  DATE OF ADMISSION:  08/13/2010 DATE OF DISCHARGE:  08/26/2010                              DISCHARGE SUMMARY   DISCHARGE DIAGNOSES: 1. Left subcortical infarct with right hemiparesis. 2. Diabetes mellitus type 2. 3. Hypertension. 4. Right femoral neuralgia.  HISTORY OF PRESENT ILLNESS:  Victoria Thornton is a 70 year old female with history of diabetes mellitus, hypertension admitted to [facility] on August 09, 2010, with right-sided weakness, right facial droop, and difficulty walking.  CT of head was negative for acute changes.  MRI of brain showed acute posterior limb left internal capsule infarct.  Per reports carotid Dopplers done showed 50% to 79% right ICA stenosis and left 16% to 49% ICA stenosis.  The patient was started on aspirin for CVA prophylaxis.  She was also noted to have elevated triglycerides at 248 and HDL noted to be low at 38.  Currently the patient continues with right hemiparesis with dysarthria and dysphagia due to decrease in oral range of motion and strength.  Initially on D1 diet, but reports has been advanced to D3 thin liquids.  The patient was evaluated by rehab team and felt that she would benefit from a CIR program.  PAST MEDICAL HISTORY:  Significant for, 1. DM type 2. 2. Depression. 3. History of suicide attempt in 2005. 4. Appendectomy.  REVIEW OF SYMPTOMS:  Positive for weakness and numbness of right hand, insomnia, as well as some decreased mood.  FAMILY HISTORY:  Positive for TIA and coronary artery disease.  SOCIAL HISTORY:  The patient lives alone at [facility].  Was independent prior to admission.  Plans are to discharge to friend's home, which is one level with two steps at entry.   Quit tobacco x10 years.  Has an alcoholic drink once a month.  FUNCTIONAL HISTORY:  The patient was independent prior to admission.  FUNCTIONAL STATUS:  The patient is contact guard assist for balance and at the bed.  Min assist to stand and ambulate 65 feet with rolling walker.  Requires verbal cues for safety.  PHYSICAL EXAMINATION:  VITALS:  Blood pressure 124/80, pulse 84, respiratory rate 16. GENERAL:  The patient is a pleasant female, alert, no acute distress. HEENT:  Pupils round and reactive to light.  Oral mucosa is pink and moist.  Intact dentition.  Nares patent. NECK:  Supple without JVD or lymphadenopathy. LUNGS:  Clear to auscultation bilaterally without wheezes, rales, or rhonchi. HEART:  Regular rate rhythm without murmurs or gallops. ABDOMEN:  Soft, nontender with positive bowel sounds. SKIN:  Intact without any signs of breakdown. NEUROLOGIC:  Cranial nerves II through XII notable for right central VII with tongue deviation.  Visual field intact.  Speech quite dysarthric. Reflexes 1+ throughout.  Sensation decreased over right anterolateral thigh.  Judgment intact.  Orientation was intact as well as memory. Mood is pleasant.  Strength in the right upper extremity is 1-2/5 right deltoid,  biceps, and triceps.  She has trace at wrist and finger as well as hand intrinsic.  Right lower extremity is 3+ to 4/5.  Hip flexion 4/5 knee extension and flexion and 4/5 ankle dorsiflexion, plantar flexion. Left upper and left lower extremity is grossly 4-5/5 throughout.  HOSPITAL COURSE:  Victoria Thornton was admitted to rehab on August 13, 2010, for inpatient therapies to consist of PT, OT, and speech therapy at least 3 hours 5 days a week.  Past admission physiatrist, rehab RN, and therapy team have worked together to provide customized collaborative interdisciplinary care.  Rehab RN has worked with the patient on bowel and bladder program as well as assisted with  monitoring p.o. intake.  The patient's blood pressures were checked on b.i.d. basis during this stay.  These have been reasonably controlled ranging from 110s to 130s systolic 70s to 80s diastolic.  The patient has been afebrile during this stay.  She has been continent of bowel and bladder.  Labs done past admission include CBC revealing hemoglobin 12.5, hematocrit 36.5, white count 10.5, platelets 246.  Check of lytes done revealed sodium 138, potassium 4.1, chloride 102, CO2 of 28, BUN 11, creatinine 0.92, glucose 153.  LFTs within normal limits with total bili 0.8, alk phos 59, SGOT 16, SGPT less than 8, total protein 7.2, and albumin at 3.7.  The patient's blood sugars were checked on before meals and nightly basis.  Initially the patient was on Amaryl as well as metformin for diabetes management.  She was noted to drop her blood sugars as low at 60s.  Her Amaryl was discontinued.  Currently, the patient continues on metformin alone and blood sugars ranging at 120s- 150s range.  The patient is advised to check blood sugars on b.i.d. to t.i.d. basis.  She is to definitely check her blood sugars before bedtime and evenings when she was noted to have hypoglycemic episodes. She has also been educated regarding carb-modified diet.  She is to follow up with Dr. Kriste Basque regarding further adjustment in her hypoglycemic protocol.  During the patient's stay in rehab, weekly team conferences were held to monitor the patient's progress, set goals, as well as discuss barriers to discharge.  At the time of admission, the patient was limited by her right hemiparesis, poor endurance, decrease in right-sided coordination. She was noted to have decrease in Berg balance, decrease in gait speed with Berg score at 31/5.  Physical Therapy has worked with the patient on strengthening as well as balance.  Currently her Sharlene Motts score is improved to 53 out of 56.  She is currently at supervision to  modified independent level for transfers and mobility.  She requires supervision with rolling walker in community setting and to navigate stairs. Education was done with the patient's friend regarding supervision needed for stair management as well as in outpatient setting.  Further outpatient physical therapy to continue past discharge.  OT has been working with the patient on self-care tasks.  They have worked on dynamic sitting and standing balance as well as strengthening and utilization of right upper extremity in self-care tasks.  Currently, the patient has shown great improvement in right upper extremity active range of motion and coordination.  She continues to require increased time and effort to complete fine motor activities.  She is currently at supervision level with toilet and shower tub transfers.  She is modified independent for bathing and dressing.  The patient to continue with outpatient OT to further improve right upper  extremity function.  Speech Therapy has worked with the patient on dysarthria as well as monitoring tolerance of regular diet.  The patient was noted to be pocketing solid textures due to her oral motor and lingual weakness.  Currently, the patient is tolerating regular diet without difficulty.  Speech clarity has improved and the patient is able to self correct 80% of time.  She does occasionally require min assist to slow rate of speech to improve clarity of speech.  Further followup outpatient therapies to include speech therapy.  On August 26, 2010, the patient is discharged to home with intermittent supervision to be provided by friend.  Of note, at the time of discharge, the patient reported she had been using a full strength aspirin daily at home.  She was unsure if she had mentioned this to the medical staff at [facility].  Due to the patient's failure of aspirin, she was changed over to Aggrenox.  She is to start Aggrenox one p.o. per day  x1 week then go up to one p.o. b.i.d. for her CVA prophylaxis.  DISCHARGE MEDICATIONS: 1. Tylenol 325 mg p.o. q.4 h. p.r.n. pain. 2. Aggrenox one p.o. daily x1 week then one p.o. b.i.d. 3. Pepcid 20 mg p.o. b.i.d. p.r.n. 4. Flector patch to right knee b.i.d. 5. Neurontin 400 mg p.o. q.i.d. 6. Lisinopril 10 mg p.o. per day. 7. Simvastatin 20 mg p.o. at bedtime. 8. Ultram 50 mg p.o. b.i.d. p.r.n. pain. 9. Ambien 10 mg half p.o. q.p.m. p.r.n. insomnia. 10.Bupropion 75 mg 2 tablets p.o. per day. 11.Metformin 500 mg p.o. b.i.d.  DIET:  Diabetic diet.  ACTIVITY LEVEL:  Independent supervision.  No strenuous activity.  No alcohol, no smoking, no driving.  SPECIAL INSTRUCTIONS:  Do not use glimepiride.  Outpatient PT, OT, speech therapy to begin August 30, 2010, at 3:15.  FOLLOWUP:  The patient to follow up with Dr. Wynn Banker in 4 weeks. Follow up with Dr. Kriste Basque in 2 weeks for routine check.     Delle Reining, P.A.   ______________________________ Erick Colace, M.D.    PL/MEDQ  D:  08/26/2010  T:  08/27/2010  Job:  [id number]  cc:   Lonzo Cloud. Kriste Basque, MD  Electronically Signed by Osvaldo Shipper. on 08/28/2010 02:08:18 PM Electronically Signed by Claudette Laws M.D. on 09/13/2010 06:08:08 AM

## 2010-09-16 ENCOUNTER — Ambulatory Visit: Payer: Medicare Other | Admitting: Rehabilitative and Restorative Service Providers"

## 2010-09-16 ENCOUNTER — Encounter: Payer: Medicare Other | Admitting: Physical Medicine & Rehabilitation

## 2010-09-16 ENCOUNTER — Ambulatory Visit: Payer: Medicare Other | Admitting: Occupational Therapy

## 2010-09-16 ENCOUNTER — Encounter: Payer: BC Managed Care – PPO | Admitting: Occupational Therapy

## 2010-09-17 ENCOUNTER — Ambulatory Visit: Payer: Medicare Other | Attending: Physical Medicine & Rehabilitation

## 2010-09-17 DIAGNOSIS — I69922 Dysarthria following unspecified cerebrovascular disease: Secondary | ICD-10-CM | POA: Insufficient documentation

## 2010-09-17 DIAGNOSIS — R269 Unspecified abnormalities of gait and mobility: Secondary | ICD-10-CM | POA: Insufficient documentation

## 2010-09-17 DIAGNOSIS — M6281 Muscle weakness (generalized): Secondary | ICD-10-CM | POA: Insufficient documentation

## 2010-09-17 DIAGNOSIS — Z5189 Encounter for other specified aftercare: Secondary | ICD-10-CM | POA: Insufficient documentation

## 2010-09-17 DIAGNOSIS — R279 Unspecified lack of coordination: Secondary | ICD-10-CM | POA: Insufficient documentation

## 2010-09-17 DIAGNOSIS — I69998 Other sequelae following unspecified cerebrovascular disease: Secondary | ICD-10-CM | POA: Insufficient documentation

## 2010-09-18 ENCOUNTER — Ambulatory Visit: Payer: Medicare Other | Admitting: Occupational Therapy

## 2010-09-18 ENCOUNTER — Telehealth: Payer: Self-pay

## 2010-09-18 ENCOUNTER — Ambulatory Visit: Payer: Medicare Other | Admitting: Rehabilitative and Restorative Service Providers"

## 2010-09-18 ENCOUNTER — Ambulatory Visit: Payer: Medicare Other

## 2010-09-18 MED ORDER — ENOXAPARIN SODIUM 40 MG/0.4ML ~~LOC~~ SOLN: 40.0000 mg | SUBCUTANEOUS | Status: DC

## 2010-09-18 NOTE — Telephone Encounter (Signed)
Discussed bridging pt with Lovenox, while pt holding Aggrenox for upcoming R breast biopsy with Weston Brass, PharmD.  Aggrenox has irreversible platelet inhibition effects for 5-7 days.  Radiologist requests pt hold Aggrenox 3 days prior to biopsy and 3 days after.  Given the irreversible effects 5-7 days after last dose pt will not need bridging before procedure, pt will take last dose of Aggrexox on 09/19/10. Pt will be started on prophylactic dosing at 40mg  Lovenox post procedure on 5/8 and 5/9 and will resume Aggrenox on 09/26/10.  Discussed plan with Dr Kriste Basque and he agrees.  Called spoke with pt advised of above plan.  Pt would like to come into office on 5/8 and 5/9 for Lovenox injections.  Rx for Lovenox 40mg  [facility] per pt request.  She is aware to pick up rx and bring to appt on 5/8 and 5/9.

## 2010-09-18 NOTE — Telephone Encounter (Signed)
Called pt to advise of Lovenox bridging instructions for upcoming breast biopsy on 09/23/10.  Pt requested a rx by Dr Kriste Basque be sent into the [facility] for Voltaren.  Advised pt I would forward a message to Dr Jodelle Green office requesting rx. Thanks [employer]

## 2010-09-18 NOTE — H&P (Signed)
NAMEBRIANTE, Victoria Thornton NO.:  [id number]  MEDICAL RECORD NO.:  [id number]           PATIENT TYPE:  I  LOCATION:  4028                         FACILITY:  [facility]  PHYSICIAN:  Ranelle Oyster, M.D.DATE OF BIRTH:  08-03-1940  DATE OF ADMISSION:  08/13/2010 DATE OF DISCHARGE:                             HISTORY & PHYSICAL   PRIMARY CARE PHYSICIAN:  Lonzo Cloud. Kriste Basque, MD  CHIEF COMPLAINTS:  Right-sided weakness and dysarthria.  HISTORY OF PRESENT ILLNESS:  This is a pleasant 70 year old white female with diabetes and hypertension, admitted to [facility] on August 09, 2010, for right-sided weakness and facial droop.  CT was negative for acute change.  MRI of the brain showed acute posterior limb, left internal capsule infarct.  The patient had carotid Dopplers done showing 50-79% right ICA stenosis and 16-49% on the left.  She is started on aspirin for stroke prophylaxis.  She is noted to have elevated triglycerides and started on niacin.  She has had problems with right hemiparesis, dysarthria and dysphagia, and decreased oral movement strength.  She initially was on D1 diet, but advanced to D3 thin liquids currently.  Rehab was asked to evaluate the patient, and fell ultimately she could benefit from an inpatient stay.  REVIEW OF SYSTEMS:  Notable for insomnia, depression, weakness, numbness.  Full 12-point review is in the written H and P.  PAST MEDICAL HISTORY:  Positive for: 1. Type 2 diabetes.2 2. Depression. 3. Suicide attempts in 2005. 4. Appendectomy.  FAMILY HISTORY:  Positive for TIA and CAD.  SOCIAL HISTORY:  The patient lives alone at [facility].  She is independent prior to arrival.  She plans to go to a friend's home in [place], lives in 1-level house, 2 steps to enter.  She quit tobacco 10 years ago and drinks rarely.  ALLERGIES:  None.  HOME MEDICATIONS: 1. Glyburide 5 daily. 2. Metformin 500 mg b.i.d. 3. Wellbutrin XL 300  mg daily. 4. Ambien 10 mg nightly p.r.n.  LABORATORY DATA:  Hemoglobin 13.1, white count 8.7, platelets 268. Sodium 137, potassium 4.3, BUN 21, creatinine 0.7.  PHYSICAL EXAMINATION:  VITAL SIGNS:  Today's blood pressure is 124/80, pulse 84, respiratory rate 16. GENERAL:  The patient is pleasant, alert, no acute stress. HEENT:  Pupils equally round and reactive to light.  Nose and throat exam is notable for intact dentition.  Pink and moist mucosa. NECK:  Supple without JVD or lymphadenopathy. CHEST:  Clear to auscultation bilaterally without wheezes, rales, or rhonchi. HEART:  Regular rate and rhythm without murmurs, rubs, or gallops. ABDOMEN:  Soft, nontender.  Bowel sounds are positive. SKIN:  Intact throughout with no signs of obvious breakdown. NEUROLOGICAL:  Cranial nerves II through XII notable for right central VII and tongue deviation.  Visual fields were intact.  Speech was quite dysarthric.  Reflexes are 1+ throughout.  Sensation was decreased over the right anterolateral thigh.  Judgment was intact.  Orientation was intact as well as memory.  Mood was very pleasant.  Strength in the right upper extremity was 1-2/5 right deltoid, biceps and triceps.  She has trace at the wrist  and finger and hand/hand intrinsics.  Right lower extremity strength is 3+-4/5 hip flexors, 4/5 knee extensor, flexors; 4/5 ankle dorsiflexor and plantar flexors.  Left upper and lower extremity grossly is 4-5/5 throughout.  POST ADMISSION PHYSICIAN EVALUATION: 1. Functional deficit secondary to left basal ganglia infarct with     right hemiparesis, right facial weakness and dysarthria. 2. The patient is admitted to receive collaborative interdisciplinary     care between the physiatrist, rehab nursing staff, and therapy     team. 3. The patient's level of medical complexity and substantial therapy     needs in context of that medical necessity cannot be provided at a     lesser intensity of  care. 4. The patient experienced substantial functional loss from her     baseline.  Premorbidly, she was independent.  Currently, she is     contact guard assist for balance, edge of bed min assist standing,     ambulating 65 feet with rolling walker for verbal cues for safety.     Judging by the patient's diagnosis, physical exam and functional     history, she has a potential functional progress, which will result     in measurable gains while inpatient rehab.  These gains will be of     substantial and practical use upon discharge to home in     facilitating mobility and self-care. 5. Physiatrist will provide 24-hour management of medical needs as     well as oversight of therapy plan/treatment to provide guidance as     appropriate regarding interaction of the two.  Medical problem list     and plan are below number. 6. A 24-hour rehab nursing team will assist in management of the     patient's skin care needs as well as bowel and bladder function,     integration of therapy concepts and techniques. 7. PT will assess and treat for lower extremity strength, range of     motion, balance, functional mobility and gait with goals modified     independent. 8. OT will assess and treat for upper extremity use, ADLs, adaptive     technique, equipment, neuromuscular education, safety, functional     mobility and goals modified independent to occasional set up. 9. Speech language pathology will follow and treat for dysphagia as     well as dysarthria with goals modified independent for swallowing     and supervision to modified independent with speech. 10.Case management and social worker will assess and treat for     psychosocial issues and discharge planning. 11.Team conference will be held weekly to assess progress towards     goals and to determine barriers at discharge. 12.The patient has demonstrated sufficient medical stability and     exercise capacity to tolerate at least 3 hours  therapy per day at     least 5 days week. 13.Estimated length of stay is 7-10 days.  Prognosis is good.  MEDICAL PROBLEMS LIST AND PLAN: 1. DVT prophylaxis:  Subcu Lovenox.  Follow up platelets and regular     CBCs. 2. Mood::  The patient is on Wellbutrin.  She seems to be very     positive.  We will provide further positive reinforcement on rehab. 3. Type 2 diabetes:  Check CBCs a.c. and h.s., cover with sliding     scale insulin.  She will continue with metformin and glyburide per     home regimen. 4. Stroke prophylaxis:  Aspirin 325 mg p.o.  daily. 5. Hypertension:  Monitor while on lisinopril.  Recheck blood pressure     with increased activity and adjust dosage as needed. 6. Dyslipidemia:  Zocor. 7. 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  No signs of back pain on exam nor pain associated with     this presentation.  Observe only for now.     Ranelle Oyster, M.D.     ZTS/MEDQ  D:  08/13/2010  T:  08/14/2010  Job:  [id number]  cc:   Lonzo Cloud. Kriste Basque, MD  Electronically Signed by Faith Rogue M.D. on 09/18/2010 09:44:58 PM

## 2010-09-18 NOTE — Telephone Encounter (Signed)
lmomtcb x1 

## 2010-09-18 NOTE — Telephone Encounter (Signed)
Pt is requesting an RX for Voltaren. She says she had one and this really helped with her leg pain. Pls advise.

## 2010-09-19 MED ORDER — DICLOFENAC SODIUM 75 MG PO TBEC: DELAYED_RELEASE_TABLET | ORAL | Status: DC

## 2010-09-19 NOTE — Telephone Encounter (Signed)
Spoke w/ pt and she is aware rx was sent to pharmacy. Pt needed nothing else further

## 2010-09-19 NOTE — Telephone Encounter (Signed)
Per SN--voltaren 75mg    #60 and generic is ok to use--1 po bid with food prn for arthritis pain. thanks

## 2010-09-19 NOTE — Telephone Encounter (Signed)
lmomtcb x1 to advise pt rx was sent to pharmacy 

## 2010-09-23 ENCOUNTER — Ambulatory Visit: Payer: Medicare Other

## 2010-09-23 ENCOUNTER — Other Ambulatory Visit: Payer: Self-pay | Admitting: Radiology

## 2010-09-23 ENCOUNTER — Ambulatory Visit: Payer: Medicare Other | Admitting: Occupational Therapy

## 2010-09-23 ENCOUNTER — Ambulatory Visit: Payer: BC Managed Care – PPO | Admitting: Rehabilitative and Restorative Service Providers"

## 2010-09-24 ENCOUNTER — Other Ambulatory Visit (INDEPENDENT_AMBULATORY_CARE_PROVIDER_SITE_OTHER): Payer: Medicare Other

## 2010-09-24 ENCOUNTER — Encounter (INDEPENDENT_AMBULATORY_CARE_PROVIDER_SITE_OTHER): Payer: Medicare Other | Admitting: Vascular Surgery

## 2010-09-24 ENCOUNTER — Telehealth: Payer: Self-pay | Admitting: Pulmonary Disease

## 2010-09-24 DIAGNOSIS — I6529 Occlusion and stenosis of unspecified carotid artery: Secondary | ICD-10-CM

## 2010-09-24 NOTE — Telephone Encounter (Signed)
lmomtcb x1 

## 2010-09-24 NOTE — Telephone Encounter (Signed)
Pt states she gave herself the Lovenox injection and wanted SN to be aware.

## 2010-09-24 NOTE — Telephone Encounter (Signed)
SN is aware of message.  

## 2010-09-25 ENCOUNTER — Ambulatory Visit: Payer: BC Managed Care – PPO | Admitting: Rehabilitative and Restorative Service Providers"

## 2010-09-25 ENCOUNTER — Other Ambulatory Visit: Payer: Self-pay | Admitting: Vascular Surgery

## 2010-09-25 ENCOUNTER — Ambulatory Visit: Payer: Medicare Other | Admitting: Occupational Therapy

## 2010-09-25 DIAGNOSIS — I639 Cerebral infarction, unspecified: Secondary | ICD-10-CM

## 2010-09-26 NOTE — Consult Note (Signed)
NEW PATIENT CONSULTATION  Victoria Thornton, Victoria Thornton DOB:  [birth year]                                       09/24/2010 ZOXWR#:[id number]  The patient presents for evaluation of recent left brain stroke.  She is a very pleasant 70 year old white female who was admitted to [facility] on 08/09/2010.  She had difficulty with speech and difficulty with her right hand.  She eventually was discharged and was at [facility] for a short stint as well.  I do not have her actual carotid Dopplers, but I have a report suggesting a 50% to 79% right internal carotid and 16% to 49% left internal carotid artery stenosis. She is having a slow recovery.  Her main disability continues to be some word searching and some slurred speech.  She is able to write with her right hand.  She is right-handed.  She had no prior neurologic deficits prior to this event.  PAST HISTORY:  Significant for non-insulin dependent diabetes for approximately 8 years, has elevated triglycerides, has no cardiac history, does have history of vocal cord polyps and appendectomy in the past.  SOCIAL HISTORY:  She is single with 1 child.  She is retired.  She quit smoking 10 years ago and does not drink alcohol.  FAMILY HISTORY:  Positive for cardiac disease in her father with myocardial infarction.  REVIEW OF SYSTEMS:  GENERAL:  No weight loss or gain.  She weighs 190 pounds.  She is 5 feet 4 inches tall. VASCULAR:  Positive for recent stroke with slurred speech. CARDIAC:  Negative. GI: Negative. NEUROLOGIC:  Negative prior to her stroke. PULMONARY:  Negative. MUSCULOSKELETAL:  Positive for recent thigh discomfort. Otherwise, review of systems is negative.  PHYSICAL EXAMINATION:  General:  Well-developed, well-nourished white female appearing her stated age in no acute distress.  Vital Signs: Blood pressure 116/75, pulse 83, respirations 18.  HEENT:  Normal.  I do not hear carotid bruits  bilaterally.  Chest:  Clear bilaterally.  Heart: Regular rate and rhythm.  Her radial pulses are 2+.  Abdomen:  Nontender with no masses noted.  Musculoskeletal:  Shows no __________ cyanosis. Neurologic:  She does have some word searching.  Skin:  Without ulcers or rashes.  She had been on aspirin prior to this event and has been on Aggrenox since the event.  She underwent repeat carotid duplex in our office, and this shows no evidence of significant stenosis bilaterally with approximately 40% stenosis bilaterally.  I discussed this with the patient.  I do not see any evidence of cervical carotid disease causing this.  Due to no other known cause with no cardiac difficulty and no significant bifurcation disease, I have recommended CT angiogram of her arch and carotids for further evaluation to rule out a more proximal source.  Assuming this is negative, we would recommend continued follow up of her moderate carotid stenoses.  We will discuss her CT with her following the study.    Larina Earthly, M.D. Electronically Signed  TFE/MEDQ  D:  09/24/2010  T:  09/25/2010  Job:  [id number]  cc:   Lonzo Cloud. Kriste Basque, MD

## 2010-09-26 NOTE — Procedures (Unsigned)
CAROTID DUPLEX EXAM  INDICATION:  Carotid disease.  HISTORY: Diabetes:  Yes. Cardiac:  No. Hypertension:  Yes. Smoking:  Previous. Previous Surgery:  No. CV History:  CVA x6 weeks 08/09/2010. Amaurosis Fugax No, Paresthesias No, Hemiparesis No                                      RIGHT             LEFT Brachial systolic pressure:         138               136 Brachial Doppler waveforms:         WNL               WNL Vertebral direction of flow:        Antegrade         Antegrade DUPLEX VELOCITIES (cm/sec) CCA peak systolic                   72                90 ECA peak systolic                   85                82 ICA peak systolic                   128               110 ICA end diastolic                   27                35 PLAQUE MORPHOLOGY:                  Heterogeneous     Heterogeneous PLAQUE AMOUNT:                      Mild              Mild PLAQUE LOCATION:                    ICA               Bulb, ICA and distal CCA  IMPRESSION:  Bilateral 40%-59% stenosis within internal carotid arteries secondary to vessel tortuosity.  Bilateral intimal thickening within the common carotid arteries.   ___________________________________________ Larina Earthly, M.D.  OD/MEDQ  D:  09/24/2010  T:  09/24/2010  Job:  [id number]

## 2010-09-27 ENCOUNTER — Other Ambulatory Visit: Payer: Medicare Other

## 2010-09-30 ENCOUNTER — Other Ambulatory Visit (INDEPENDENT_AMBULATORY_CARE_PROVIDER_SITE_OTHER): Payer: Medicare Other

## 2010-09-30 ENCOUNTER — Ambulatory Visit: Payer: Medicare Other | Admitting: Occupational Therapy

## 2010-09-30 ENCOUNTER — Ambulatory Visit: Payer: Medicare Other | Admitting: Rehabilitative and Restorative Service Providers"

## 2010-09-30 ENCOUNTER — Ambulatory Visit (INDEPENDENT_AMBULATORY_CARE_PROVIDER_SITE_OTHER): Payer: Medicare Other | Admitting: Pulmonary Disease

## 2010-09-30 ENCOUNTER — Ambulatory Visit: Payer: Medicare Other

## 2010-09-30 DIAGNOSIS — E663 Overweight: Secondary | ICD-10-CM

## 2010-09-30 DIAGNOSIS — E119 Type 2 diabetes mellitus without complications: Secondary | ICD-10-CM

## 2010-09-30 DIAGNOSIS — R030 Elevated blood-pressure reading, without diagnosis of hypertension: Secondary | ICD-10-CM

## 2010-09-30 DIAGNOSIS — I6350 Cerebral infarction due to unspecified occlusion or stenosis of unspecified cerebral artery: Secondary | ICD-10-CM

## 2010-09-30 DIAGNOSIS — I639 Cerebral infarction, unspecified: Secondary | ICD-10-CM | POA: Insufficient documentation

## 2010-09-30 DIAGNOSIS — E7800 Pure hypercholesterolemia, unspecified: Secondary | ICD-10-CM

## 2010-09-30 DIAGNOSIS — I679 Cerebrovascular disease, unspecified: Secondary | ICD-10-CM

## 2010-09-30 NOTE — Progress Notes (Signed)
Subjective:    Patient ID: Victoria Thornton, female    DOB: 1941-03-16, 70 y.o.   MRN: [id number]  HPI 70 y/o WF here for a follow up visit... she lives in [place], [place] but still maintains her medical care here in [place];  She has mult medical problems including:  Hx VC polyp & chr hoarseness;  Chronic Bronchitis/ ex-smoker;  MVP;  Hyperchol;  DM;  Overweight;  GERD;  Divertics/ Colon Polyps;  STROKE;  Vit D defic;  Anxiety & Depression...  ~  January 28, 2010:  68mo ROV- feeling well & preparing for trip to [place] (wants Cipro & ZPak)... no bronchitic symptoms, CXR 3/11 NAD, & voice stable;  Chol controlled on diet alone but LDL not at goal & she will continue diet/ exercise/ etc;  BS improved at home w/ MetformBid + Glimep2 & diet (wt down 4#);  she is seeing Ortho in [place]- on Pred5mg /d x30d & this really helped but warned re her DM control;  she didn't stick w/ the Vit D 2000u daily & asked to restart.  ~  August 30, 2010:  Orly had a left brain stroke 08/09/10 & was adm to [facility] in [place], [place];  We don't have their direct work-up data yet but she was transferred to [facility] for rehab & I have reviewed all the [facility] data & DrKirsteins summary:  Her initial MRI showed an acute infarct in the post limb of the left internal capsule;  There was a report of an abn Carotid doppler as well (50-79% RICA &16-49% LICA stenoses);  She apparently did not receive Neurology or VascSurg consults;  Several med adjustments made> she agreed to start Simva20, they stopped her Glimepiride, added Aggrenox, added low dose Lisinopril, added Neurontin for a painful right sided neuropathy...  ~  Sep 30, 2010:  52mo ROV & she had VascSurg consult w/ DrEarly 09/24/10 w/ repeat CDopplers showing 40-59% bilat ICA stenoses felt secondary to vessel tortuosity (only mild heterogenous plaque noted) & intimal thickening in the common carotids; he rec CT Angio of the neck vessels ==> pending...  She also had  right breast lesion on routine mammogram at [facility] 4/12> Bx showed fibrocystic change w/ calcif, no malig...  She has about 2 weeks to go on her rehab> had ?right meralgia pain w/ injections from DrKirstens, also using Voltaren which seems to help;  She tells me she will be returing to [facility] in several weeks...      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 she is an ex-smoker and quit ~2001... seen by DrShoemaker for ENT in 1998. ~  4/12:  She had a left brain stroke 3/12 w/ mild aphasia & some dysphonia;  She passed the swallowing tests in rehab...  Hx of BRONCHITIS, CHRONIC (ICD-491.9) - ex-smoker quit ~2001... she has min cough, no sputum, chr DOE without change, and denies CP, palpit, CHF symptoms, edema, etc... ~  CXR 3/10 showed clear, NAD...  F/u CXR 3/11 is stable, no change... ~  Awaiting records from [facility] for CXR report ==> pending  HBP>  She was noted to have mild HBP in the hosp 4/12 & LISINOPRIL 10mg /d started. ~  5/12:  BP= 112/68 & she feels well- denies CP/ Palpit/ SOB (x deconditioned)/ edema/ etc...  MITRAL VALVE PROLAPSE (ICD-424.0) - clinical dx... never had 2DEcho... intermittent msc heard over the  years... no CP, palpit, etc... ~  Awaiting records from [place] 3/12 to check for 2DEcho==> pending  HYPERCHOLESTEROLEMIA - prev on meds but pt stopped them on her own and is using red yeast rice and OTC fish oil supplements...  ~  FLP 1/08 showed TChol 187, TG 165, HDL 42, LDL 112... Discussed low chol, low fat diets. ~  FLP 1/09 showed TChol 205, TG 132, HDL 46, LDL 141... offered low dose Crestor, she prefers diet. ~  FLP 3/10 showed TChol 195, TG 134, HDL 38, LDL 130 ~  FLP 3/11 showed TChol 194, TG 201, HDL 50, LDL 126 ~  3/12:  Placed on SIMVASTATIN 20mg /d after her stroke... ~  FLP 4/12 on Simva20 (but not really fasting, she says) showed TChol 136, TG  290, HDL 44, LDL 58... Needs better low fat diet & wt reduction.  DIABETES MELLITUS - review of chart show FBS~120-130 range & HgA1c= low6's for several yrs on diet alone...  ~  she is asymptomatic, prev eval by Endocrinologist in [place], [place]... ~  labs 1/09  (wt=210#) showed FBS= 136, HgA1c= 6.9.Marland KitchenMarland Kitchen rec to start meds but she prefers diet Rx... ~  labs 3/10 (wt=209#) showed BS= 214, A1c= not done... rec> start METFORMIN 500Bid. ~  labs 9/10 (wt=200#) showed BS= 194, A1c= 7.9.Marland Kitchen. rec> add GLIMEPIRIDE 2mg /d. ~  labs 3/11 (wt=207#) showed BS= 160, A1c= 6.9.Marland KitchenMarland Kitchen continue both meds. ~  labs 9/11 (wt=203#) showed BS= 165, A1c= 6.7.Marland KitchenMarland Kitchen Stable on Metform+Glimep. ~  3/12:  Hosp w/ Stroke & sent to Rehab at [facility]> Glimep stopped & Metform 500Bid continued. ~  Labs 4/12 showed BS= 101 on MetforminBid, continue same. ~  Labs 5/12 showed A1c= 7.1.Marland KitchenMarland Kitchen rec better diet & wt reduction  OVERWEIGHT (ICD-278.02) - not really dieting or exercising by her admission... we discussed diet + exercise program...  GERD (ICD-530.81) - some reflux laryngitis suspected in the past... without prev EGD... she has had increased heartburn symptoms recently and we discussed starting OTC PRILOSEC 20mg /d taken 30 min before dinner (she uses this Prn only).  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 f/u planned 5 yrs.  STROKE > 08/09/10 presented to [facility] in [place], [place] w/ right sided weakness, right facial droop, difficulty walking, & aphasic;  Dx w/ infarct in post limb of the left internal capsule on MRI, & abnormal CDopplers ==> we have requested the hard copy data for our review...  we are referring her to [facility] to repeat studies & establish baseline... ~  5/12:  Pt seen by DrEarly, [facility]> repeat CDopplers showed 40-59% bilat ICA stenoses felt secondary to vessel tortuosity (only mild heterogenous plaque  noted) & intimal thickening in the common carotids; he rec CT Angio of the neck vessels ==> pending.  VITAMIN D DEFICIENCY (ICD-268.9) ~  labs 3/11 showed Vit D level = 20... rec> start 2000 u Vit D OTC daily & stay on this.  DYSTHYMIA (ICD-300.4) - hx anxiety and depression in past... followed by [facility] (sees Dr. Jules Schick) and prev on Celexa... prev suicidal gesture w/ Tylenol overdose after divorce from her 3rd husb...  ~  3/10:  she notes increased symptoms of depression and will f/u w DrPittman and psychologist Phylliss Blakes... ~  9/11:  she reports now on Rx w/ [facility] 75mg /d.   Past Surgical History  Procedure Date  . Appendectomy   . Tonsillectomy  Outpatient Encounter Prescriptions as of 09/30/2010  Medication Sig Dispense Refill  . acetaminophen (TYLENOL) 325 MG tablet Take 650 mg by mouth every 4 (four) hours as needed.        Marland Kitchen buPROPion (WELLBUTRIN) 75 MG tablet Take 150 mg by mouth daily.       . Calcium Carbonate-Vitamin D (CALTRATE 600+D) 600-400 MG-UNIT per tablet Take 1 tablet by mouth daily.        . Cholecalciferol (VITAMIN D3) 2000 UNITS TABS Take 1 capsule by mouth daily.        . diclofenac (VOLTAREN) 75 MG EC tablet 1 tablet twice a day with food as needed for arthritis pain  60 tablet  1  . dipyridamole-aspirin (AGGRENOX) 25-200 MG per 12 hr capsule Take 1 capsule by mouth 2 (two) times daily.  180 capsule  3  . lisinopril (PRINIVIL,ZESTRIL) 10 MG tablet Take 1 tablet (10 mg total) by mouth daily.  90 tablet  3  . metFORMIN (GLUCOPHAGE) 500 MG tablet Take 500 mg by mouth 2 (two) times daily with a meal.        . Multiple Vitamins-Minerals (WOMENS DAILY FORMULA PO) Take 1 capsule by mouth daily.        . simvastatin (ZOCOR) 20 MG tablet Take 1 tablet (20 mg total) by mouth at bedtime.  90 tablet  3  . traMADol (ULTRAM) 50 MG tablet Take 1 tablet (50 mg total) by mouth 2 (two) times daily as needed for pain. Take one By mouth twice daily as needed  100  tablet  2  . zolpidem (AMBIEN) 10 MG tablet Take 0.5 tablets (5 mg total) by mouth at bedtime as needed.  30 tablet  2  . DISCONTD: gabapentin (NEURONTIN) 400 MG capsule Take 1 capsule (400 mg total) by mouth 4 (four) times daily.  360 capsule  3    Allergies  Allergen Reactions  . Atorvastatin     REACTION: pt just refuses to take Lipitor    Review of Systems    Constitutional:  Denies F/C/S, anorexia, unexpected weight change. HEENT:  No HA, visual changes, earache, nasal symptoms, sore throat; +hoarseness chronically. Resp:  No cough, sputum, hemoptysis; no SOB, tightness, wheezing. Cardio:  No CP, palpit, orthopnea, edema;   +DOE & starting exerc program. GI:  Denies N/V/D/C or blood in stool; no reflux, abd pain, distention, or gas. GU:  No dysuria, freq, urgency, hematuria, or flank pain. MS:  Denies joint pain, swelling, tenderness; no neck pain, back pain, etc. Neuro:  No tremors, seizures, dizziness, syncope; +weakness, +paresthesias & abn gait. Skin:  No suspicious lesions or skin rash. Heme:  No adenopathy, bruising, bleeding. Psyche: Denies confusion, sleep disturbance, hallucinations;  +anxiety, +depression.    Objective:   Physical Exam  WD, Overweight, 70 y/o WF in NAD... Mild facial asymmetry noted, mild dysarthria. GENERAL:  Alert & oriented; pleasant & cooperative...  VS reviewed. HEENT:  [place]/AT, EOM-wnl, PERRLA, Fundi-benign, EACs-clear, TMs-wnl, NOSE-clear, THROAT-clear & wnl, sl hoarse without change. NECK:  Supple w/ fairROM; no JVD; normal carotid impulses w/o bruits; no thyromegaly or nodules palpated; no lymphadenopathy. CHEST:  Clear without wheezes, rales, or rhonchi heard... HEART:  Regular Rhythm; without murmurs/ rubs/ or gallops detected... ABDOMEN:  Soft & nontender; normal bowel sounds; no organomegaly or masses palpated... EXT: without deformities, mild arthritic changes; no varicose veins/ venous insuffic/ or edema. NEURO: mild right central  facial paresis; mild right sided weakness & min incoordination... DERM:  neg- without lesions seen,  no rash, etc...   Assessment & Plan:   STROKE>  She had a left brain infarct w/ some mild residual deficits;  She is to continue outpt rehab;  Continue Aggrenox Bid;  She had eval from DrEarly [facility] w/ CDoppler's above, he's ordered CT Angio neck> pending...  CHRONIC BRONCHITIC, ex-smoke>  Stable, doing satis, no additional Rx needed...  MVP>  Hx clinical MVP, & not really symptomatic previously;  We did not have baseline 2DEcho on her;  ?if 2DEcho done at [facility] & we have sent for the hard-copy records to review;  If not done prev then we will do in follow up...  CHOL>  She was started on Simv20 w/ her stroke;  Recent FLP was not actually fasting & Chol numbers looked good but TG elev- she wants to get on diet & get wt down!  DM>  Glimep was stopped due to low sugars w/ hosp diet & poor intake after her stroke;  On Metformin 500Bid alone now & BS= 101 & A1c=7.1, continue same & we are following...  Dysthymia>  She has hx anxiety & depression;  Followed by [facility] on Wellbutrin & Ambien for rest, continue same.Marland KitchenMarland Kitchen

## 2010-09-30 NOTE — Patient Instructions (Signed)
Today we updated your med list in EPIC...    For now> continue your current meds the same...  Today we did your follow up A1c test    Please call the PHONE [facility]REE in a few days for your results...    Dial [id number] & when prompted enter your patient number followed by the # symbol...    Your patient number is:  [id number]  Call for any problems... Let's work on weight reduction via a low carb, ,low fat diet... Let's plan a follow up visit (with FASTING blood work) in 3-4 months

## 2010-10-02 ENCOUNTER — Ambulatory Visit: Payer: Medicare Other

## 2010-10-02 ENCOUNTER — Ambulatory Visit: Payer: Medicare Other | Admitting: Occupational Therapy

## 2010-10-02 ENCOUNTER — Ambulatory Visit: Payer: Medicare Other | Admitting: Rehabilitative and Restorative Service Providers"

## 2010-10-03 ENCOUNTER — Ambulatory Visit: Payer: Medicare Other | Admitting: Physical Medicine & Rehabilitation

## 2010-10-04 ENCOUNTER — Inpatient Hospital Stay: Admission: RE | Admit: 2010-10-04 | Payer: Medicare Other | Source: Ambulatory Visit

## 2010-10-04 ENCOUNTER — Telehealth: Payer: Self-pay | Admitting: Pulmonary Disease

## 2010-10-04 NOTE — Discharge Summary (Signed)
NAMEHANNI, Victoria             ACCOUNT NO.:  [id number]   MEDICAL RECORD NO.:  [id number]          PATIENT TYPE:  INP   LOCATION:  A215                          FACILITY:  [facility]   PHYSICIAN:  Margaretmary Dys, M.D.DATE OF BIRTH:  01/21/1941   DATE OF ADMISSION:  04/20/2004  DATE OF DISCHARGE:  12/04/2005LH                                 DISCHARGE SUMMARY   DISCHARGE DIAGNOSES:  1.  Acute Tylenol poisoning.  2.  Apparent suicide.  3.  Diabetes mellitus.  4.  Depression.   DISCHARGE MEDICATIONS:  1.  Metformin ER 500 mg p.o. b.i.d.  2.  Naproxen p.r.n.   DISCHARGE DISPOSITION:  Patient is being transferred for commitment.   HOSPITAL COURSE:  Victoria Thornton is a 70 year old Caucasian female with history  of depression in the past and diabetes mellitus, patient apparently  separated from her husband of 23 years 2 days prior to admission and then  decided to commit suicide, patient took a large quantity of Tylenol and when  she arrived in the emergency room had toxic level.  She was initially  uncooperative and mildly agitated on arrival.  Patient was subsequently  started on intravenous acetylcysteine.  Patient has done very well  throughout the course of her admission, Tylenol level is now undetectable,  her liver function tests have remained normal, PT/INR has also remained  normal.  It does appear that patient has some significant denial regarding  her degree of depression and patient will need commitment.   PERTINENT LABORATORY DATA ON ADMISSION:  White blood cell count was 8.9,  hemoglobin was 15.3, hematocrit 45.5, platelet count 339, neutrophils 82%.  PT was 12.9, INR 1, and pretty much remained about that number throughout  hospitalization.  Sodium was 131 which corrected, potassium was 3.2 that  also corrected, glucose was elevated at 194, this improved with sliding  scale.  Hemoglobin A1C was 6.4.  Drug toxicology screen acetaminophen level  was 119, salicylate was  undetectable, alcohol level was also undetectable.   DISPOSITION:  Patient being transferred to a mental health institution for  further evaluation and management.  I have discussed this with the patient  who is agreeable to the plan.   DIET:  Low salt diet.   ACTIVITY:  As tolerated and as recommended by physicians in the mental  hospital.     Ayor   AM/MEDQ  D:  04/21/2004  T:  04/21/2004  Job:  [id number]

## 2010-10-04 NOTE — H&P (Signed)
NAME:  Victoria Thornton, Victoria Thornton             ACCOUNT NO.:  [id number]   MEDICAL RECORD NO.:  [id number]          PATIENT TYPE:  EMS   LOCATION:  ED                            FACILITY:  [facility]   PHYSICIAN:  Mobolaji B. Bakare, M.D.DATE OF BIRTH:  21-Sep-1940   DATE OF ADMISSION:  04/20/2004  DATE OF DISCHARGE:  LH                                HISTORY & PHYSICAL   CHIEF COMPLAINT:  Overdose of Tylenol.   HISTORY OF PRESENTING COMPLAINT:  [name] is a 70 year old Caucasian  female with past medical history of diabetes mellitus who recently got  separated from her husband of 23 years.   DICTATION ENDED AT THIS POINT     Mobo   MBB/MEDQ  D:  04/20/2004  T:  04/20/2004  Job:  [id number]

## 2010-10-04 NOTE — Discharge Summary (Signed)
NAME:  Victoria Thornton, Victoria Thornton NO.:  [id number]   MEDICAL RECORD NO.:  [id number]          PATIENT TYPE:  IPS   LOCATION:  0306                          FACILITY:  [facility]   PHYSICIAN:  Jeanice Lim, M.D. DATE OF BIRTH:  May 11, 1941   DATE OF ADMISSION:  04/21/2004  DATE OF DISCHARGE:  04/23/2004                                 DISCHARGE SUMMARY   IDENTIFYING DATA:  This is a 70 year old, Caucasian female, married,  voluntarily admitted, transferred to medicine service after treatment of  Tylenol overdose, took a large amount of Tylenol P.M. after kicking husband  out of home, after learning that he had had an affair two weeks previously.  The patient reported that she did not understand the lethality of Tylenol,  endorsed irritability, grief, disruptive sleep, some depressive moods.  Per  her report, she was not intentionally trying to really kill herself.   PAST PSYCHIATRIC HISTORY:  1.  First admission, no prior attempts.  2.  History of treatment for depression ten years ago with Zoloft and      Sarafem.   The patient had been a court reporter, is in third marriage.   MEDICAL PROBLEMS:  1.  Arthritis.  2.  Diabetes.  3.  Status post significant Tylenol overdose.  4.  Appendectomy.  5.  Lumpectomy.   MEDICATIONS:  Acetylcysteine IV protocol and no routine medications.   ALLERGIES:  No known drug allergies.   PHYSICAL EXAMINATION:  NEUROLOGIC:  Within normal limits.  MENTAL STATUS:  Fully alert, irritable.  Affect some tearfulness.  Speech  normal.  Mood depressed.  Irritable.  Thought process mostly goal directed.  Angry at times.  Content intact.  Judgment/insight impaired.   ROUTINE ADMISSION LABS:  Essentially within normal limits.  Urinalysis  negative.  Urine drug screen negative.  Liver function test were within  normal limits.   ADMITTING DIAGNOSES:   AXIS I:  1.  Major depressive disorder, recurrent severe.  2.  Adjustment disorder.   AXIS II:   Deferred.   AXIS III:  See previous medical history dictated.   AXIS IV:  1.  Problems with primary support group.  2.  Conflict with husband.   AXIS V:  30/55 to 60.   The patient was admitted, ordered routine and p.r.n. medications, underwent  further monitoring, was encouraged to participate in individual and group  therapy, placed on safety checks, Prozac restarted due to past history of  response.  The patient was doing quite well, had good insight and judgment,  was sleeping well, appropriate affect, and had developed an aftercare plan  proactive, problem solving, tolerating medications, sleeping eating well,  and showing no inappropriate or dangerous behavior.  The patient was  discharged in an improved condition.  Mood euthymic.  Affect full.  Thought  process goal directed.  The patient was motivated to seek help to deal with  stressful situations and especially counseling to work on pattern that she  had some insight to.   She was given medication education and discharged on fluoxetine 10 mg q.a.m.   Had a follow up appointment  with Gwen __________ April 29, 2004 at 12:30.  Follow up with Dr. Raquel James on Thursday, April 25, 2004 at 11 a.m.   DISCHARGE DIAGNOSES:   AXIS I:  1.  Major depressive disorder, recurrent severe.  2.  Adjustment disorder.   AXIS II:  Deferred.   AXIS III:  See previous medical history dictated.   AXIS IV:  1.  Problems with primary support group.  2.  Conflict with husband.   AXIS V:  Global assessment of functioning 55.     Jame   JEM/MEDQ  D:  05/15/2004  T:  05/15/2004  Job:  [id number]

## 2010-10-04 NOTE — Telephone Encounter (Signed)
She is on mulitple meds that could be contributing to her nausea Would try bland diet, small frequent meal. Gas x with meals.  Take meds with foods If not helping may need ov.  Please contact office for sooner follow up if symptoms do not improve or worsen or seek emergency care '

## 2010-10-04 NOTE — Telephone Encounter (Signed)
Spoke with pt. She states that she "all of these medicines are making me sick"- she states that she has felt queezy x 2 days and relates this to her meds. I ask if she is taking something new and she states that she is not. Wants SN to look at her med list and see if there is anything that could be causing this. She also states that she has been eating less to try and control her DM.  Will forward to TP to address, pls advise thanks

## 2010-10-04 NOTE — H&P (Signed)
NAME:  Victoria Thornton, Victoria Thornton             ACCOUNT NO.:  [id number]   MEDICAL RECORD NO.:  [id number]          PATIENT TYPE:  EMS   LOCATION:  ED                            FACILITY:  [facility]   PHYSICIAN:  Mobolaji B. Bakare, M.D.DATE OF BIRTH:  Feb 09, 1941   DATE OF ADMISSION:  04/20/2004  DATE OF DISCHARGE:  LH                                HISTORY & PHYSICAL   PRIMARY CARE PHYSICIAN:  Dr. Lonzo Cloud. Nadel.   CHIEF COMPLAINT:  Tylenol overdose.   HISTORY OF THE PRESENTING COMPLAINT:  Victoria Thornton is a 70 year old Caucasian  female with history of depression in the past and diabetes mellitus.  She  got separated from her husband of 23 years 2 days ago and decided to commit  suicide yesterday.  She called her daughter yesterday afternoon to inform  her that she would be committing suicide and she dissuaded her over the  phone.  She, however, took Tylenol of unknown quantity about 6 p.m., then  she called her daughter, leaving a message on the answering machine and  daughter called EMS when she got the message.  She had 2 episodes of  vomiting, 1 prior to arrival at the emergency department and 1 in the  emergency department.  She arrived at the ER 7-1/2 hours after the  ingestion.  The patient was initially uncooperative and mildly agitated on  arrival, but now she is cooperative, but not giving full details of the  history; she cannot recall the quantity of Tylenol that she used.  She used  Tylenol PM.   REVIEW OF SYSTEMS:  Review of systems negative for abdominal pain, diarrhea  or constipation.  No shortness of breath, no chest pain, no fever, no  headaches.   PAST MEDICAL HISTORY:  1.  Diabetes mellitus.  2.  Depression.  3.  Arthritis in both feet.   PAST SURGICAL HISTORY:  Appendectomy.   MEDICATIONS:  1.  She is supposed to be on Metformin ER 500 mg b.i.d., however, the      patient is not taking this medication.  2.  Naproxen.   FAMILY HISTORY:  She remarried to immediate  ex-husband about 23 years ago.  Her daughter is from her first marriage.  She found that her husband was  cheating on her about 10 days ago, hence, she has been undergoing a lot of  emotional issues and finally got separated from her husband 2 days ago.   Father died in his 50s from myocardial infarction.  There is a family  history of diabetes mellitus, no family history of cancer.   SOCIAL HISTORY:  She does not smoke cigarettes, she does not use IV drugs,  occasionally drinks wine.   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   PHYSICAL EXAMINATION:  INITIAL VITALS ON ADMISSION:  Blood pressure 191/103,  temperature 96.8, pulse rate 110, respiratory rate of 24.  Current blood  pressure 170/90, respiratory rate of 18, pulse rate 89, O2 saturations 100%  on 2 L.  GENERAL:  On examination, the patient is alert, oriented in time, place and  person, not  in respiratory distress.  HEENT:  Normocephalic, atraumatic head.  Pupils equal, round and reactive to  light.  Extraocular muscle movement intact.  No jaundice.  Not pale.  NECK:  No cervical lymphadenopathy, no carotid bruit, no elevated JVD.  LUNGS:  Lungs clear clinically to auscultation.  CVS:  S1 and S2, regular, no murmur, no gallop, no rub.  ABDOMEN:  Abdomen not distended, soft, nontender.  No hepatosplenomegaly.  Bowel sounds present.  EXTREMITIES:  No pedal edema, no calf tenderness.  Dorsalis pedis pulses  bilaterally, 2+ bilaterally.  CNS:  No focal neurologic deficits.  SKIN:  No rash, no petechiae.   LABORATORY AND ACCESSORY CLINICAL DATA:  Acetaminophen level 119; this was  approximately 8 hours after ingestion of Tylenol.  Alcohol level less than  5.  Salicylate less than 4.0.  Urine drug screen negative.  PT 12.9, INR  1.0, PTT 30, AST 25, ALT 15, total protein 9.0, albumin 5.1, bilirubin  normal.  Initial set of cardiac enzymes normal.  Sodium 131, potassium 3.2,  chloride 95, CO2 24, glucose 194, BUN 7,  creatinine 0.9, calcium 9.4.   ASSESSMENT AND PLAN:  Victoria Thornton is a 70 year old white female with history  of depression and type 2 diabetes mellitus, who attempted to commit suicide  due to marital __________ situation and stress, found to have used Tylenol  and Tylenol level of 119 mcg/mL 8 hours after ingestion, which is compatible  with toxic level.  Emergency department already discussed with [facility] and it was recommended to start intravenous N-acetyl cysteine.  The patient has already received loading dose of N-acetyl cysteine 150  mg/kg;  she is currently on 15 mg/kg per hour, which will run for 23 hours.   DIAGNOSES:  1.  Tylenol overdose.  2.  Suicidal attempt.  3.  Depression.  4.  Hypokalemia.  5.  Mild hyponatremia.  6.  Diabetes mellitus.   PLAN:  Plan will be to continue with N-acetyl cysteine, repeat PT/INR,  hepatic profile and Tylenol level at 6 p.m. today, intravenous fluid of  normal saline at 125 mL/hr, 1-on-1 watch, psychiatric evaluation, Phenergan  12.5 mg intravenously q.6 h. p.r.n. for nausea and vomiting, checking  hemoglobin A1c, CBC and differential, sliding-scale insulin.     Mobo  MBB/MEDQ  D:  04/20/2004  T:  04/20/2004  Job:  [id number]   cc:   Lonzo Cloud. Kriste Basque, M.D. [facility]

## 2010-10-04 NOTE — Telephone Encounter (Signed)
lmomtcb x1 

## 2010-10-06 ENCOUNTER — Encounter: Payer: Self-pay | Admitting: Pulmonary Disease

## 2010-10-07 ENCOUNTER — Ambulatory Visit: Payer: Medicare Other

## 2010-10-07 ENCOUNTER — Encounter: Payer: Medicare Other | Admitting: Physical Medicine & Rehabilitation

## 2010-10-07 ENCOUNTER — Ambulatory Visit: Payer: Medicare Other | Admitting: Rehabilitative and Restorative Service Providers"

## 2010-10-07 ENCOUNTER — Ambulatory Visit: Payer: Medicare Other | Admitting: Occupational Therapy

## 2010-10-07 NOTE — Telephone Encounter (Signed)
Called, spoke with pt.  She was informed of recs per TP.  She verbalized understanding of this and stated she would try this and would call back if this does not help.

## 2010-10-09 ENCOUNTER — Ambulatory Visit: Payer: Medicare Other | Admitting: Occupational Therapy

## 2010-10-09 ENCOUNTER — Ambulatory Visit: Payer: Medicare Other | Admitting: Rehabilitative and Restorative Service Providers"

## 2010-10-10 ENCOUNTER — Ambulatory Visit
Admission: RE | Admit: 2010-10-10 | Discharge: 2010-10-10 | Disposition: A | Discharge status: Home / Self Care | Payer: Medicare Other | Source: Ambulatory Visit | Attending: Vascular Surgery | Admitting: Vascular Surgery

## 2010-10-10 ENCOUNTER — Other Ambulatory Visit: Payer: Medicare Other

## 2010-10-10 DIAGNOSIS — I639 Cerebral infarction, unspecified: Secondary | ICD-10-CM

## 2010-10-10 MED ORDER — IOHEXOL 350 MG/ML SOLN
100.0000 mL | Freq: Once | INTRAVENOUS | Status: AC | PRN
  Administered 2010-10-10: 100 mL via INTRAVENOUS

## 2010-10-15 ENCOUNTER — Encounter: Payer: Self-pay | Admitting: Pulmonary Disease

## 2010-10-16 ENCOUNTER — Ambulatory Visit: Payer: Medicare Other | Admitting: Rehabilitative and Restorative Service Providers"

## 2010-10-16 ENCOUNTER — Ambulatory Visit: Payer: Medicare Other | Admitting: Occupational Therapy

## 2010-10-17 ENCOUNTER — Telehealth: Payer: Self-pay | Admitting: Pulmonary Disease

## 2010-10-17 MED ORDER — DICLOFENAC SODIUM 1 % TD GEL: TRANSDERMAL | Status: AC

## 2010-10-17 NOTE — Telephone Encounter (Signed)
RX sent to the pharmacy. Pt notified

## 2010-10-17 NOTE — Telephone Encounter (Signed)
Per SN---ok for voltaren gel   1 tube use as directed and refill prn.  thanks

## 2010-10-17 NOTE — Telephone Encounter (Signed)
Spoke with the patient and she is requesting an RX for voltaren Gel for her leg. She states she has been using some of her brothers and it helps her leg pain. She is also taking voltaren tablets daily. Please advise. Carron Curie, CMA

## 2010-10-18 ENCOUNTER — Ambulatory Visit
Payer: Medicare Other | Attending: Physical Medicine & Rehabilitation | Admitting: Rehabilitative and Restorative Service Providers"

## 2010-10-18 ENCOUNTER — Telehealth: Payer: Self-pay | Admitting: Pulmonary Disease

## 2010-10-18 ENCOUNTER — Encounter: Payer: Medicare Other | Admitting: Occupational Therapy

## 2010-10-18 DIAGNOSIS — R269 Unspecified abnormalities of gait and mobility: Secondary | ICD-10-CM | POA: Insufficient documentation

## 2010-10-18 DIAGNOSIS — I69998 Other sequelae following unspecified cerebrovascular disease: Secondary | ICD-10-CM | POA: Insufficient documentation

## 2010-10-18 DIAGNOSIS — I69922 Dysarthria following unspecified cerebrovascular disease: Secondary | ICD-10-CM | POA: Insufficient documentation

## 2010-10-18 DIAGNOSIS — Z5189 Encounter for other specified aftercare: Secondary | ICD-10-CM | POA: Insufficient documentation

## 2010-10-18 DIAGNOSIS — R279 Unspecified lack of coordination: Secondary | ICD-10-CM | POA: Insufficient documentation

## 2010-10-18 DIAGNOSIS — M6281 Muscle weakness (generalized): Secondary | ICD-10-CM | POA: Insufficient documentation

## 2010-10-18 MED ORDER — METFORMIN HCL 500 MG PO TABS: 500.0000 mg | ORAL_TABLET | Freq: Two times a day (BID) | ORAL | Status: DC

## 2010-10-18 NOTE — Telephone Encounter (Signed)
Pt is aware of refills sent to pharmacy for metformin

## 2010-10-18 NOTE — Telephone Encounter (Signed)
lmomtcb x1 

## 2010-10-22 ENCOUNTER — Telehealth: Payer: Self-pay | Admitting: Pulmonary Disease

## 2010-10-22 NOTE — Telephone Encounter (Signed)
Called spoke with patient who is requesting that SN be asked if she is "healthy enough to sky dive."  Pt states that this is something she's always wanted to do and has a discount coupon for it to use by the end of the year.  Pt is thinking about doing this for her 70th birthday in august.    [place], please advise thanks!

## 2010-10-22 NOTE — Telephone Encounter (Signed)
SN will call and speak with the pt.

## 2010-10-24 ENCOUNTER — Telehealth: Payer: Self-pay | Admitting: Pulmonary Disease

## 2010-10-24 NOTE — Telephone Encounter (Signed)
Called [facility], asked them to fax the PA info to the triage fax machine.  LMOM TCB x1 for pt to inform her that this PA will be initiated and that it can take up to 2 weeks to complete.

## 2010-10-25 NOTE — Telephone Encounter (Signed)
Spoke with pt and advised PA initiated. [place] and she wants rx sent to [facility] once PA is completed. Carron Curie, CMA

## 2010-10-25 NOTE — Telephone Encounter (Signed)
LMTCBx1 to advise that PA has been initiated. Form placed on SN look-at to complete.  Carron Curie, CMA

## 2010-10-25 NOTE — Telephone Encounter (Signed)
Returning triage's call.

## 2010-10-25 NOTE — Telephone Encounter (Signed)
Form filled out by SN and faxed back to [facility].  Will await approval / denial.

## 2010-10-28 NOTE — Telephone Encounter (Signed)
Called and spoke with pt.  Pt aware of the PA denial and pt will have to pay out of pocket for this med.  Pt verbalized understanding and denied any questions.

## 2010-10-28 NOTE — Telephone Encounter (Signed)
Denial received for Voltaren Gel and placed on SN cart for review. Letter includes criteria for appeal if SN wishes to do so.

## 2010-10-28 NOTE — Telephone Encounter (Addendum)
PA has been done and was denied by the pts insurance company.  Pt will have to pay out of pocket for this med per her insurance company. thanks

## 2010-10-29 ENCOUNTER — Telehealth: Payer: Self-pay | Admitting: Pulmonary Disease

## 2010-10-29 NOTE — Telephone Encounter (Signed)
Called and spoke with pt.  Pt is requesting the phone # on the papers for the PA denial of her voltaren gel.  Leigh, can you help me with this please.  Thanks.

## 2010-10-29 NOTE — Telephone Encounter (Signed)
The number on the form is   [phone].

## 2010-10-29 NOTE — Telephone Encounter (Signed)
Spoke with pt and notified of number to [employer].

## 2010-10-29 NOTE — Telephone Encounter (Signed)
LMOMTCB

## 2010-10-30 NOTE — Telephone Encounter (Signed)
Called and spoke with pt and reviewed the questions with pt on the prior auth for the voltaren gel.  Pt stated that she did not know what these questions are asking but tried to answer them.  Pt stated that she uses the voltaren gel and takes the voltaren tablets daily and she stated that she just got this filled and does not have any further refills.

## 2010-10-30 NOTE — Telephone Encounter (Signed)
lmomtcb for pt to review questions on this form with pt.

## 2010-10-30 NOTE — Telephone Encounter (Signed)
Called [employer] to see if we could get another PA form for the voltaren gel--they informed me that this med has been denied by her insurance so the next step will have to be a letter from [facility] stating:   pts name, #id, case # [id number], name and strength of med and how often used, any tried steroids or if she ever has and failed with name and strength, doctor name and phone number.  Mail to--pharmacy appeals coordinator   [phone]  [place]  [address].

## 2010-10-30 NOTE — Telephone Encounter (Signed)
Spoke with pt. She states that she called her insurance co to find out of PA for voltaren gel was denied. She states that they told her that it was b/c the form we filled out was "ambiguous" and we said that she has tried and failed the wrong meds. Also states that we said that she takes anti-steroid med which she says she does not. Will forward this to SN.

## 2010-10-30 NOTE — Telephone Encounter (Signed)
Pt returning call can be reached at [phone].Victoria Thornton

## 2010-10-31 ENCOUNTER — Encounter: Payer: Self-pay | Admitting: Pulmonary Disease

## 2010-10-31 MED ORDER — DICLOFENAC SODIUM 75 MG PO TBEC: DELAYED_RELEASE_TABLET | ORAL | Status: DC

## 2010-10-31 NOTE — Telephone Encounter (Signed)
Called and spoke with pt and explained to her that the insurance will not cover the voltaren tablets and gel.  Per SN the tablets are better in his opinion, but pt can choose which she would rather use.  Pt stated that she will cont with the tablets.  Pt requested that rx for 90 day supply be printed and mailed to the pt.  Printed out rx and will mail to pt today.  She is aware that if she wants to cont the voltaren gel she will need to pay for this out of pocket.

## 2010-12-02 ENCOUNTER — Telehealth: Payer: Self-pay | Admitting: Pulmonary Disease

## 2010-12-02 MED ORDER — GLUCOSE BLOOD VI STRP: ORAL_STRIP | Status: AC

## 2010-12-02 NOTE — Telephone Encounter (Signed)
[email or web address].Lucinda Spells, CMA  

## 2010-12-02 NOTE — Telephone Encounter (Signed)
Pt needs refills on her One Touch Ultra Mini test strips. [facility] [place], [place]. Pt aware.

## 2010-12-04 ENCOUNTER — Telehealth: Payer: Self-pay | Admitting: *Deleted

## 2010-12-04 NOTE — Telephone Encounter (Signed)
Done

## 2010-12-20 ENCOUNTER — Other Ambulatory Visit: Payer: Self-pay | Admitting: Pulmonary Disease

## 2011-01-08 ENCOUNTER — Encounter: Payer: Self-pay | Admitting: Pulmonary Disease

## 2011-01-15 ENCOUNTER — Telehealth: Payer: Self-pay | Admitting: Pulmonary Disease

## 2011-01-15 NOTE — Telephone Encounter (Signed)
ATC line busy x3.  According to last OV pt needs to have fasting labs done

## 2011-01-16 NOTE — Telephone Encounter (Signed)
Pt aware she needs to be fasting

## 2011-03-03 ENCOUNTER — Encounter: Payer: Self-pay | Admitting: Pulmonary Disease

## 2011-03-03 ENCOUNTER — Ambulatory Visit (INDEPENDENT_AMBULATORY_CARE_PROVIDER_SITE_OTHER): Payer: Medicare Other | Admitting: Pulmonary Disease

## 2011-03-03 DIAGNOSIS — E7800 Pure hypercholesterolemia, unspecified: Secondary | ICD-10-CM

## 2011-03-03 DIAGNOSIS — E119 Type 2 diabetes mellitus without complications: Secondary | ICD-10-CM

## 2011-03-03 DIAGNOSIS — I6350 Cerebral infarction due to unspecified occlusion or stenosis of unspecified cerebral artery: Secondary | ICD-10-CM

## 2011-03-03 DIAGNOSIS — E663 Overweight: Secondary | ICD-10-CM

## 2011-03-03 DIAGNOSIS — J381 Polyp of vocal cord and larynx: Secondary | ICD-10-CM

## 2011-03-03 DIAGNOSIS — K5730 Diverticulosis of large intestine without perforation or abscess without bleeding: Secondary | ICD-10-CM

## 2011-03-03 DIAGNOSIS — D126 Benign neoplasm of colon, unspecified: Secondary | ICD-10-CM

## 2011-03-03 DIAGNOSIS — K219 Gastro-esophageal reflux disease without esophagitis: Secondary | ICD-10-CM

## 2011-03-03 DIAGNOSIS — R030 Elevated blood-pressure reading, without diagnosis of hypertension: Secondary | ICD-10-CM

## 2011-03-03 DIAGNOSIS — I639 Cerebral infarction, unspecified: Secondary | ICD-10-CM

## 2011-03-03 DIAGNOSIS — J42 Unspecified chronic bronchitis: Secondary | ICD-10-CM

## 2011-03-03 DIAGNOSIS — F341 Dysthymic disorder: Secondary | ICD-10-CM

## 2011-03-03 DIAGNOSIS — I679 Cerebrovascular disease, unspecified: Secondary | ICD-10-CM

## 2011-03-03 MED ORDER — AZITHROMYCIN 250 MG PO TABS: ORAL_TABLET | ORAL | Status: AC

## 2011-03-03 NOTE — Progress Notes (Signed)
Subjective:    Patient ID: Victoria Thornton, female    DOB: 1941-01-24, 70 y.o.   MRN: [id number]  HPI 70 y/o WF here for a follow up visit... she lives in [place], [place] but still maintains her medical care here in [place];  She has mult medical problems including:  Hx VC polyp & chr hoarseness;  Chronic Bronchitis/ ex-smoker;  MVP;  Hyperchol;  DM;  Overweight;  GERD;  Divertics/ Colon Polyps;  STROKE;  Vit D defic;  Anxiety & Depression...  ~  January 28, 2010:  579mo ROV- feeling well & preparing for trip to [place] (wants Cipro & ZPak)... no bronchitic symptoms, CXR 3/11 NAD, & voice stable;  Chol controlled on diet alone but LDL not at goal & she will continue diet/ exercise/ etc;  BS improved at home w/ MetformBid + Glimep2 & diet (wt down 4#);  she is seeing Ortho in [place]- on Pred5mg /d x30d & this really helped but warned re her DM control;  she didn't stick w/ the Vit D 2000u daily & asked to restart.  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 in [place], [place];  We don't have their direct work-up data yet but she was transferred to [facility] for rehab & I have reviewed all the [facility] data & DrKirsteins summary:  Her initial MRI showed an acute infarct in the post limb of the left internal capsule;  There was a report of an abn Carotid doppler as well (50-79% RICA &16-49% LICA stenoses);  She apparently did not receive Neurology or VascSurg consults;  Several med adjustments made> she agreed to start Simva20, they stopped her Glimepiride, added Aggrenox, added low dose Lisinopril, added Neurontin for a painful right sided neuropathy...  ~  Sep 30, 2010:  22mo ROV & she had VascSurg consult w/ DrEarly 09/24/10 w/ repeat CDopplers showing 40-59% bilat ICA stenoses felt secondary to vessel tortuosity (only mild heterogenous plaque noted) & intimal thickening in the common carotids; he rec CT Angio of the neck vessels ==> no signif extracranial flow  reduction...  She also had right breast lesion on routine mammogram at [facility] 4/12> Bx showed fibrocystic change w/ calcif, no malig...  She has about 2 weeks to go on her rehab> had ?right meralgia pain w/ injections from DrKirstens, also using Voltaren which seems to help;  She tells me she will be returning to [facility] in several weeks...  ~  March 03, 2011:  79mo ROV> she lives at [facility] & brought labs from her primary care for [place] to review==> BS=122, A1c=6.2, TG=189, otherw OK...    Chr Hoarseness> Hx VC polyp- no change in voice, quit smoking ~2001, aphasia from stroke 3/12 has improved w/ therapy...    Hx Chr Bronchitis> min cough, no sputum, chr DOE w/o change, she is encouraged to incr her exercise...    HBP> on Lisinopril 10mg ; BP= 142/78 & even better at home she says; denies CP, palpit, dizzy, ch in SOB, edema, etc...    Chol> on Simva20 + diet; wt is down 11# to 184# now & she is encouraged to keep up the good work; FLP 9/12 from LMD revealed TChol 141, TG 189, HDL 44, LDL 59...    DM> on Metformin 500mg  bid; labs from LMD 9/12 showed BS=122, A1c=6.2; rec to continue same...    Overweight> on diet + exercise; weight today= 184# which is down  12# from last visit!!!    GERD> she uses OTC Prilosec as needed; denies swallowing difficulty etc...    Divertics, Polyp> denies low GI symptoms or problems & f/u colon due from [facility] 7/13...    Hx STROKE> on AGGRENOX; she is followed by Neurology in the [facility] area, pt feels that she's not talking or writing as well s before & will check w/ her specialist...    Vit D defic> on Women's MVI & Vit D 2000u daily...    Hx anxiety/ depression> on Wellbutrin 75mg - 2/d & Ambien 10mg  qhs...       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 she is an ex-smoker and quit ~2001... seen by DrShoemaker for ENT in 1998. ~  4/12:  She had a left brain  stroke 3/12 w/ mild aphasia & some dysphonia;  She passed the swallowing tests in rehab...  Hx of BRONCHITIS, CHRONIC (ICD-491.9) - ex-smoker quit ~2001... she has min cough, no sputum, chr DOE without change, and denies CP, palpit, CHF symptoms, edema, etc... ~  CXR 3/10 showed clear, NAD...  F/u CXR 3/11 is stable, no change... ~  Awaiting records from [facility] for CXR report ==> pending  HBP>  She was noted to have mild HBP in the hosp 4/12 & LISINOPRIL 10mg /d started. ~  5/12:  BP= 112/68 & she feels well- denies CP/ Palpit/ SOB (x deconditioned)/ edema/ etc...  MITRAL VALVE PROLAPSE (ICD-424.0) - clinical dx... never had 2DEcho... intermittent msc heard over the years... no CP, palpit, etc... ~  Awaiting records from [facility] 3/12 to check for 2DEcho==> pending  HYPERCHOLESTEROLEMIA - prev on meds but pt stopped them on her own and is using red yeast rice and OTC fish oil supplements...  ~  FLP 1/08 showed TChol 187, TG 165, HDL 42, LDL 112... Discussed low chol, low fat diets. ~  FLP 1/09 showed TChol 205, TG 132, HDL 46, LDL 141... offered low dose Crestor, she prefers diet. ~  FLP 3/10 showed TChol 195, TG 134, HDL 38, LDL 130 ~  FLP 3/11 showed TChol 194, TG 201, HDL 50, LDL 126 ~  3/12:  Placed on SIMVASTATIN 20mg /d after her stroke... ~  FLP 4/12 on Simva20 (but not really fasting, she says) showed TChol 136, TG 290, HDL 44, LDL 58... Needs better low fat diet & wt reduction.  DIABETES MELLITUS - review of chart show FBS~120-130 range & HgA1c= low6's for several yrs on diet alone...  ~  she is asymptomatic, prev eval by Endocrinologist in [place], [place]... ~  labs 1/09  (wt=210#) showed FBS= 136, HgA1c= 6.9.Marland KitchenMarland Kitchen rec to start meds but she prefers diet Rx... ~  labs 3/10 (wt=209#) showed BS= 214, A1c= not done... rec> start METFORMIN 500Bid. ~  labs 9/10 (wt=200#) showed BS= 194, A1c= 7.9.Marland Kitchen. rec> add GLIMEPIRIDE 2mg /d. ~  labs 3/11 (wt=207#) showed BS= 160,  A1c= 6.9.Marland KitchenMarland Kitchen continue both meds. ~  labs 9/11 (wt=203#) showed BS= 165, A1c= 6.7.Marland KitchenMarland Kitchen Stable on Metform+Glimep. ~  3/12:  Hosp w/ Stroke & sent to Rehab at [facility]> Glimep stopped & Metform 500Bid continued. ~  Labs 4/12 showed BS= 101 on MetforminBid, continue same. ~  Labs 5/12 showed A1c= 7.1.Marland KitchenMarland Kitchen rec better diet & wt reduction  OVERWEIGHT (ICD-278.02) - not really dieting or exercising by her admission... we discussed diet + exercise program...  GERD (ICD-530.81) - some reflux laryngitis  suspected in the past... without prev EGD... she has had increased heartburn symptoms recently and we discussed starting OTC PRILOSEC 20mg /d taken 30 min before dinner (she uses this Prn only).  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 f/u planned 5 yrs.  STROKE > 08/09/10 presented to [facility] in [place], [place] w/ right sided weakness, right facial droop, difficulty walking, & aphasic;  Dx w/ infarct in post limb of the left internal capsule on MRI, & abnormal CDopplers ==> we have requested the hard copy data for our review...  we are referring her to [facility] to repeat studies & establish baseline... ~  5/12:  Pt seen by DrEarly, [facility]> repeat CDopplers showed 40-59% bilat ICA stenoses felt secondary to vessel tortuosity (only mild heterogenous plaque noted) & intimal thickening in the common carotids; he rec CT Angio of the neck vessels ==> pending.  VITAMIN D DEFICIENCY (ICD-268.9) ~  labs 3/11 showed Vit D level = 20... rec> start 2000 u Vit D OTC daily & stay on this.  DYSTHYMIA (ICD-300.4) - hx anxiety and depression in past... followed by [facility] (sees Dr. Jules Schick) and prev on Celexa... prev suicidal gesture w/ Tylenol overdose after divorce from her 3rd husb...  ~  3/10:  she notes increased symptoms of depression and will f/u w DrPittman and psychologist Phylliss Blakes... ~  9/11:  she  reports now on Rx w/ WELLBUTRIN 75mg /d.   Past Surgical History  Procedure Date  . Appendectomy   . Tonsillectomy     Outpatient Encounter Prescriptions as of 03/03/2011  Medication Sig Dispense Refill  . buPROPion (WELLBUTRIN) 75 MG tablet Take 150 mg by mouth daily.       Marland Kitchen dipyridamole-aspirin (AGGRENOX) 25-200 MG per 12 hr capsule Take 1 capsule by mouth 2 (two) times daily.  180 capsule  3  . glucose blood test strip Use as instructed  100 each  5  . lisinopril (PRINIVIL,ZESTRIL) 10 MG tablet Take 1 tablet (10 mg total) by mouth daily.  90 tablet  3  . metFORMIN (GLUCOPHAGE) 500 MG tablet TAKE 1 TABLET TWICE A DAY  180 tablet  0  . simvastatin (ZOCOR) 20 MG tablet Take 1 tablet (20 mg total) by mouth at bedtime.  90 tablet  3  . traMADol (ULTRAM) 50 MG tablet Take 1 tablet (50 mg total) by mouth 2 (two) times daily as needed for pain. Take one By mouth twice daily as needed  100 tablet  2  . zolpidem (AMBIEN) 10 MG tablet Take 0.5 tablets (5 mg total) by mouth at bedtime as needed.  30 tablet  2  . acetaminophen (TYLENOL) 325 MG tablet Take 650 mg by mouth every 4 (four) hours as needed.        . Calcium Carbonate-Vitamin D (CALTRATE 600+D) 600-400 MG-UNIT per tablet Take 1 tablet by mouth daily.        . Cholecalciferol (VITAMIN D3) 2000 UNITS TABS Take 1 capsule by mouth daily.        . diclofenac sodium (VOLTAREN) 1 % GEL Use as directed  1 Tube  6  . Multiple Vitamins-Minerals (WOMENS DAILY FORMULA PO) Take 1 capsule by mouth daily.          Allergies  Allergen Reactions  . Atorvastatin     REACTION: pt just refuses to take Lipitor    Current Medications, Allergies, Past Medical History, Past Surgical History, Family  History, and Social History were reviewed in [employer] record.    Review of Systems  Constitutional:  Denies F/C/S, anorexia, unexpected weight change. HEENT:  No HA, visual changes, earache, nasal symptoms, sore throat; +hoarseness  chronically. Resp:  No cough, sputum, hemoptysis; no SOB, tightness, wheezing. Cardio:  No CP, palpit, orthopnea, edema;   +DOE & starting exerc program. GI:  Denies N/V/D/C or blood in stool; no reflux, abd pain, distention, or gas. GU:  No dysuria, freq, urgency, hematuria, or flank pain. MS:  Denies joint pain, swelling, tenderness; no neck pain, back pain, etc. Neuro:  No tremors, seizures, dizziness, syncope; +weakness, +paresthesias & abn gait. Skin:  No suspicious lesions or skin rash. Heme:  No adenopathy, bruising, bleeding. Psyche: Denies confusion, sleep disturbance, hallucinations;  +anxiety, +depression.    Objective:  Physical Exam  WD, Overweight, 70 y/o WF in NAD... Mild facial asymmetry noted, mild dysarthria. GENERAL:  Alert & oriented; pleasant & cooperative...  VS reviewed. HEENT:  [facility]/AT, EOM-wnl, PERRLA, Fundi-benign, EACs-clear, TMs-wnl, NOSE-clear, THROAT-clear & wnl, sl hoarse without change. NECK:  Supple w/ fairROM; no JVD; normal carotid impulses w/o bruits; no thyromegaly or nodules palpated; no lymphadenopathy. CHEST:  Clear without wheezes, rales, or rhonchi heard... HEART:  Regular Rhythm; without murmurs/ rubs/ or gallops detected... ABDOMEN:  Soft & nontender; normal bowel sounds; no organomegaly or masses palpated... EXT: without deformities, mild arthritic changes; no varicose veins/ venous insuffic/ or edema. NEURO: mild right central facial paresis; mild right sided weakness & min incoordination... DERM:  neg- without lesions seen, no rash, etc...   Assessment & Plan:   STROKE>  She had a left brain infarct w/ some mild residual deficits;  She is to continue outpt rehab;  Continue Aggrenox Bid;  She had eval from DrEarly [facility] w/ CDoppler's above, he's ordered CT Angio neck> pending...  CHRONIC BRONCHITIC, ex-smoke>  Stable, doing satis, no additional Rx needed...  MVP>  Hx clinical MVP, & not really symptomatic previously;  We did not have baseline  2DEcho on her;  ?if 2DEcho done at [facility] & we have sent for the hard-copy records to review;  If not done prev then we will do in follow up...  CHOL>  She was started on Simv20 w/ her stroke;  Recent FLP was not actually fasting & Chol numbers looked good but TG elev- she wants to get on diet & get wt down!  DM>  Glimep was stopped due to low sugars w/ hosp diet & poor intake after her stroke;  On Metformin 500Bid alone now & labs much improved after her wt reduction...  Dysthymia>  She has hx anxiety & depression;  Followed by [facility] on Wellbutrin & Ambien for rest, continue same...   HPI  Review of Systems  Physical Exam

## 2011-03-03 NOTE — Patient Instructions (Signed)
Today we updated your med list in EPIC...    We wrote a new prescription for a ZPAK to use as needed...    Continue the aggrenox for now & consider a follow up eval w/ your Neurologist after the first of the year...  We reviewed your previous data, and the labs from your primary care physician from [phone]...  Keep up the good work w/ diet & exercise...  Call for any questions or if we can be of service in any way.Marland KitchenMarland Kitchen

## 2011-03-04 ENCOUNTER — Telehealth: Payer: Self-pay | Admitting: Pulmonary Disease

## 2011-03-04 NOTE — Telephone Encounter (Signed)
lmomtcb x1 

## 2011-03-05 NOTE — Telephone Encounter (Signed)
lmomtcb x1 

## 2011-03-06 NOTE — Telephone Encounter (Signed)
Pt called back and she was concerned about the dx that was on her discharge sheet from Monday.   We went through all the diagnosis on the sheet and i explained to the pt that this is our new system and this is how the discharge sheets print out.  She is aware that some of the diagnosis on the sheet were from a long time ago but these cannot be taken out of her medical history.  Pt voiced her understanding of this.

## 2011-03-17 ENCOUNTER — Encounter: Payer: Self-pay | Admitting: Pulmonary Disease

## 2011-06-13 ENCOUNTER — Other Ambulatory Visit: Payer: Self-pay | Admitting: Pulmonary Disease

## 2011-09-23 ENCOUNTER — Other Ambulatory Visit: Payer: Medicare Other

## 2011-09-23 ENCOUNTER — Ambulatory Visit: Payer: Medicare Other | Admitting: Vascular Surgery

## 2011-09-30 ENCOUNTER — Ambulatory Visit: Payer: Medicare Other | Admitting: Vascular Surgery

## 2011-09-30 ENCOUNTER — Other Ambulatory Visit: Payer: Medicare Other

## 2011-10-03 ENCOUNTER — Telehealth: Payer: Self-pay | Admitting: Pulmonary Disease

## 2011-10-03 NOTE — Telephone Encounter (Signed)
Will forward to Leigh to have SN advise what labs he would like to draw on patient. Will call patient once in system. Thanks.

## 2011-10-06 ENCOUNTER — Other Ambulatory Visit: Payer: Self-pay | Admitting: Pulmonary Disease

## 2011-10-06 DIAGNOSIS — E119 Type 2 diabetes mellitus without complications: Secondary | ICD-10-CM

## 2011-10-06 DIAGNOSIS — E559 Vitamin D deficiency, unspecified: Secondary | ICD-10-CM

## 2011-10-06 DIAGNOSIS — E7800 Pure hypercholesterolemia, unspecified: Secondary | ICD-10-CM

## 2011-10-06 DIAGNOSIS — F419 Anxiety disorder, unspecified: Secondary | ICD-10-CM

## 2011-10-06 DIAGNOSIS — R030 Elevated blood-pressure reading, without diagnosis of hypertension: Secondary | ICD-10-CM

## 2011-10-06 DIAGNOSIS — D126 Benign neoplasm of colon, unspecified: Secondary | ICD-10-CM

## 2011-10-06 NOTE — Telephone Encounter (Signed)
LMTCB x 1 

## 2011-10-06 NOTE — Telephone Encounter (Signed)
Orders are in the computer for the pt to complete her fasting labs prior to ov.  thanks

## 2011-10-07 NOTE — Telephone Encounter (Signed)
I spoke with pt and aware labs have been entered in epic. Nothing further was needed

## 2011-10-20 ENCOUNTER — Encounter: Payer: Self-pay | Admitting: Neurosurgery

## 2011-10-21 ENCOUNTER — Encounter: Payer: Self-pay | Admitting: Neurosurgery

## 2011-10-21 ENCOUNTER — Ambulatory Visit (INDEPENDENT_AMBULATORY_CARE_PROVIDER_SITE_OTHER): Payer: Medicare Other | Admitting: Neurosurgery

## 2011-10-21 ENCOUNTER — Ambulatory Visit (INDEPENDENT_AMBULATORY_CARE_PROVIDER_SITE_OTHER): Payer: Medicare Other | Admitting: *Deleted

## 2011-10-21 VITALS — BP 145/73 | HR 77 | Resp 16 | Ht 62.0 in | Wt 192.6 lb

## 2011-10-21 DIAGNOSIS — I6529 Occlusion and stenosis of unspecified carotid artery: Secondary | ICD-10-CM | POA: Insufficient documentation

## 2011-10-21 NOTE — Progress Notes (Signed)
[facility] HISTORY AND PHYSICAL   CC: Annual carotid duplex Referring Physician: Early  History of Present Illness: 71 year old female patient of Dr. early seen for known carotid stenosis. Patient did have a CEA in March 2012 and was hospitalized in [facility] where she resides. The patient's had no more difficulty since that time. She did have a inpatient stay at [place] in the rehabilitation unit. The patient has no signs or symptoms of CVA, TIA, amaurosis or any neural deficit. 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  Past Medical History  Diagnosis Date  . Chronic hoarseness   . Vocal cord polyp   . Chronic bronchitis   . MVP (mitral valve prolapse)   . Pure hypercholesterolemia   . Diabetes mellitus   . Overweight   . GERD (gastroesophageal reflux disease)   . Diverticulosis of colon (without mention of hemorrhage)   . Colonic polyp   . Unspecified vitamin D deficiency   . Dysthymia   . Stroke March 23,2012    ROS: [x]  Positive   [ ]  Denies    General: [ ]  Weight loss, [ ]  Fever, [ ]  chills Neurologic: [ ]  Dizziness, [ ]  Blackouts, [ ]  Seizure [ ]  Stroke, [ ]  "Mini stroke", [ ]  Slurred speech, [ ]  Temporary blindness; [ ]  weakness in arms or legs, [ ]  Hoarseness Cardiac: [ ]  Chest pain/pressure, [ ]  Shortness of breath at rest [ ]  Shortness of breath with exertion, [ ]  Atrial fibrillation or irregular heartbeat Vascular: [ ]  Pain in legs with walking, [ ]  Pain in legs at rest, [ ]  Pain in legs at night,  [ ]  Non-healing ulcer, [ ]  Blood clot in vein/DVT,   Pulmonary: [ ]  Home oxygen, [ ]  Productive cough, [ ]  Coughing up blood, [ ]  Asthma,  [ ]  Wheezing Musculoskeletal:  [ ]  Arthritis, [ ]  Low back pain, [ ]  Joint pain Hematologic: [ ]  Easy Bruising, [ ]  Anemia; [ ]  Hepatitis Gastrointestinal: [ ]  Blood in stool, [ ]  Gastroesophageal Reflux/heartburn, [ ]  Trouble swallowing Urinary: [  ] chronic Kidney disease, [ ]  on HD - [ ]  MWF or [ ]  TTHS, [ ]  Burning with urination, [ ]  Difficulty urinating Skin: [ ]  Rashes, [ ]  Wounds Psychological: [ ]  Anxiety, [ ]  Depression   Social History History  Substance Use Topics  . Smoking status: Former Smoker -- 1.0 packs/day for 40 years    Types: Cigarettes    Quit date: 05/19/1997  . Smokeless tobacco: Never Used  . Alcohol Use: Yes     social ETOH    Family History Family History  Problem Relation Age of Onset  . Prostate cancer Father   . Heart attack Father   . Heart disease Father     Heart Disease before age 10  . Diabetes Mother   . Depression Mother   . Hypertension Brother   . Multiple sclerosis Daughter     Allergies  Allergen Reactions  . Atorvastatin     REACTION: pt just refuses to take Lipitor    Current Outpatient Prescriptions  Medication Sig Dispense Refill  . aspirin 325 MG tablet Take 325 mg by mouth daily.      Marland Kitchen buPROPion (WELLBUTRIN) 75 MG tablet Take 150 mg by mouth daily.       . Calcium Carbonate-Vitamin D (CALTRATE 600+D) 600-400 MG-UNIT per tablet Take 1 tablet by mouth daily.        Marland Kitchen  glucose blood test strip Use as instructed  100 each  5  . metFORMIN (GLUCOPHAGE) 500 MG tablet TAKE 1 TABLET TWICE A DAY (NEED APPOINTMENT FOR FURTHER REFILLS)  180 tablet  3  . Multiple Vitamins-Minerals (WOMENS DAILY FORMULA PO) Take 1 capsule by mouth daily.        . simvastatin (ZOCOR) 20 MG tablet Take 1 tablet (20 mg total) by mouth at bedtime.  90 tablet  3  . zolpidem (AMBIEN) 10 MG tablet Take 0.5 tablets (5 mg total) by mouth at bedtime as needed.  30 tablet  2  . acetaminophen (TYLENOL) 325 MG tablet Take 650 mg by mouth every 4 (four) hours as needed.        . Cholecalciferol (VITAMIN D3) 2000 UNITS TABS Take 1 capsule by mouth daily.        Marland Kitchen lisinopril (PRINIVIL,ZESTRIL) 10 MG tablet Take 1 tablet (10 mg total) by mouth daily.  90 tablet  3  . traMADol (ULTRAM) 50 MG tablet Take 1 tablet  (50 mg total) by mouth 2 (two) times daily as needed for pain. Take one By mouth twice daily as needed  100 tablet  2    Physical Examination  Filed Vitals:   10/21/11 1534  BP: 145/73  Pulse: 77  Resp:     Body mass index is 35.23 kg/(m^2).  General:  WDWN in NAD Gait: Normal HEENT: WNL Eyes: Pupils equal Pulmonary: normal non-labored breathing , without Rales, rhonchi,  wheezing Cardiac: RRR, without  Murmurs, rubs or gallops; Abdomen: soft, NT, no masses Skin: no rashes, ulcers noted  Vascular Exam Pulses: 2+ radial pulses bilaterally Carotid bruits: Carotid pulses to auscultation no bruits are heard Extremities without ischemic changes, no Gangrene , no cellulitis; no open wounds;  Musculoskeletal: no muscle wasting or atrophy   Neurologic: A&O X 3; Appropriate Affect ; SENSATION: normal; MOTOR FUNCTION:  moving all extremities equally. Speech is fluent/normal  Non-Invasive Vascular Imaging CAROTID DUPLEX 10/21/2011  Right ICA 20 - 39 % stenosis Left ICA 20 - 39 % stenosis Percentages were not given on the carotid duplex exam reports she but her end-diastolic is 12 on the right, and 22 on the left.  ASSESSMENT/PLAN: Asymptomatic patient with known carotid stenosis. Her duplex numbers are somewhat better than previous. She will followup in one year for repeat carotid duplex, her questions were encouraged and answered, she is in agreement with this plan.  Lauree Chandler ANP   Clinic MD: Early

## 2011-10-25 ENCOUNTER — Other Ambulatory Visit: Payer: Self-pay | Admitting: Pulmonary Disease

## 2011-10-29 NOTE — Procedures (Unsigned)
CAROTID DUPLEX EXAM  INDICATION:  Carotid disease  HISTORY: Diabetes:  Yes Cardiac:  No Hypertension:  Yes Smoking:  Previous Previous Surgery:  No CV History:  History of CVA Amaurosis Fugax No, Paresthesias No, Hemiparesis No                                      RIGHT             LEFT Brachial systolic pressure:         164               152 Brachial Doppler waveforms:         Normal            Normal Vertebral direction of flow:        Antegrade         Antegrade DUPLEX VELOCITIES (cm/sec) CCA peak systolic                   94                115 ECA peak systolic                   93                91 ICA peak systolic                   58                73 ICA end diastolic                   12                22 PLAQUE MORPHOLOGY:                  Heterogeneous     Heterogeneous PLAQUE AMOUNT:                      Mild              Mild PLAQUE LOCATION:                    Bifurcation       ICA  IMPRESSION: 1. Doppler velocities suggest no hemodynamically significant stenoses     noted in the bilateral internal carotid arteries with plaque     formations as described above. 2. Mild decrease in the velocities at the bilateral internal carotid     arteries noted when compared to the previous exam on 09/24/2010.  ___________________________________________ Larina Earthly, M.D.  CH/MEDQ  D:  10/24/2011  T:  10/24/2011  Job:  [id number]

## 2011-12-05 ENCOUNTER — Encounter: Payer: Self-pay | Admitting: Gastroenterology

## 2011-12-08 ENCOUNTER — Encounter: Payer: Self-pay | Admitting: Gastroenterology

## 2011-12-29 ENCOUNTER — Encounter: Payer: Medicare Other | Admitting: Gastroenterology

## 2011-12-30 ENCOUNTER — Telehealth: Payer: Self-pay | Admitting: Pulmonary Disease

## 2011-12-30 NOTE — Telephone Encounter (Signed)
Labs are already in the computer for the pt. Nothing further is needed.

## 2011-12-30 NOTE — Telephone Encounter (Signed)
Pt aware of appt-however patient would like to have her fasting lab work done on Monday 01-12-12-----no call back to patient if labs are put in EPIC---if for some reason SN doesn't want labs please call patient to let her know this. Thanks.

## 2011-12-30 NOTE — Telephone Encounter (Signed)
Can add pt on for 8-28 at 2:30.  thanks

## 2011-12-30 NOTE — Telephone Encounter (Signed)
Called, spoke with pt.  States she will be coming to visit at the end of Aug and would like an appt with Dr. Kriste Basque on Aug 27 or Aug 28.  Dr. Ernest Mallick, pls advise.  Thank you.

## 2011-12-31 ENCOUNTER — Encounter: Payer: Medicare Other | Admitting: Pulmonary Disease

## 2012-01-12 ENCOUNTER — Other Ambulatory Visit (INDEPENDENT_AMBULATORY_CARE_PROVIDER_SITE_OTHER): Payer: Medicare Other

## 2012-01-12 DIAGNOSIS — F419 Anxiety disorder, unspecified: Secondary | ICD-10-CM

## 2012-01-12 DIAGNOSIS — R030 Elevated blood-pressure reading, without diagnosis of hypertension: Secondary | ICD-10-CM

## 2012-01-12 DIAGNOSIS — E7800 Pure hypercholesterolemia, unspecified: Secondary | ICD-10-CM

## 2012-01-12 DIAGNOSIS — F411 Generalized anxiety disorder: Secondary | ICD-10-CM

## 2012-01-12 DIAGNOSIS — E119 Type 2 diabetes mellitus without complications: Secondary | ICD-10-CM

## 2012-01-12 DIAGNOSIS — E559 Vitamin D deficiency, unspecified: Secondary | ICD-10-CM

## 2012-01-12 DIAGNOSIS — D126 Benign neoplasm of colon, unspecified: Secondary | ICD-10-CM

## 2012-01-12 LAB — HEPATIC FUNCTION PANEL
Alkaline Phosphatase: 50 U/L (ref 39–117)
Bilirubin, Direct: 0.1 mg/dL (ref 0.0–0.3)
Total Bilirubin: 0.3 mg/dL (ref 0.3–1.2)
Total Protein: 7.4 g/dL (ref 6.0–8.3)

## 2012-01-12 LAB — CBC WITH DIFFERENTIAL/PLATELET
Basophils Absolute: 0 10*3/uL (ref 0.0–0.1)
Basophils Relative: 0.3 % (ref 0.0–3.0)
Eosinophils Absolute: 0.2 10*3/uL (ref 0.0–0.7)
HCT: 37.5 % (ref 36.0–46.0)
Hemoglobin: 12.3 g/dL (ref 12.0–15.0)
Lymphocytes Relative: 33.6 % (ref 12.0–46.0)
Lymphs Abs: 3.1 10*3/uL (ref 0.7–4.0)
MCHC: 32.8 g/dL (ref 30.0–36.0)
MCV: 89.4 fl (ref 78.0–100.0)
Monocytes Absolute: 0.5 10*3/uL (ref 0.1–1.0)
Neutro Abs: 5.3 10*3/uL (ref 1.4–7.7)
RBC: 4.2 Mil/uL (ref 3.87–5.11)
RDW: 13.3 % (ref 11.5–14.6)

## 2012-01-12 LAB — URINALYSIS, ROUTINE W REFLEX MICROSCOPIC
Bilirubin Urine: NEGATIVE
Nitrite: NEGATIVE
Total Protein, Urine: NEGATIVE
pH: 5.5 (ref 5.0–8.0)

## 2012-01-12 LAB — LIPID PANEL
HDL: 44.9 mg/dL (ref >39.00)
LDL Cholesterol: 29 mg/dL (ref 0–99)
Total CHOL/HDL Ratio: 2
Triglycerides: 153 mg/dL — ABNORMAL HIGH (ref 0.0–149.0)

## 2012-01-12 LAB — BASIC METABOLIC PANEL
CO2: 27 mEq/L (ref 19–32)
Calcium: 9.7 mg/dL (ref 8.4–10.5)
Creatinine, Ser: 1.1 mg/dL (ref 0.4–1.2)
GFR: 51.5 mL/min — ABNORMAL LOW (ref >60.00)
Sodium: 138 mEq/L (ref 135–145)

## 2012-01-13 LAB — VITAMIN D 25 HYDROXY (VIT D DEFICIENCY, FRACTURES): Vit D, 25-Hydroxy: 48 ng/mL (ref 30–89)

## 2012-01-14 ENCOUNTER — Encounter: Payer: Self-pay | Admitting: Adult Health

## 2012-01-14 ENCOUNTER — Other Ambulatory Visit (INDEPENDENT_AMBULATORY_CARE_PROVIDER_SITE_OTHER): Payer: Medicare Other

## 2012-01-14 ENCOUNTER — Ambulatory Visit (INDEPENDENT_AMBULATORY_CARE_PROVIDER_SITE_OTHER)
Admission: RE | Admit: 2012-01-14 | Discharge: 2012-01-14 | Disposition: A | Discharge status: Home / Self Care | Payer: Medicare Other | Source: Ambulatory Visit | Attending: Adult Health | Admitting: Adult Health

## 2012-01-14 ENCOUNTER — Ambulatory Visit: Payer: Medicare Other | Admitting: Pulmonary Disease

## 2012-01-14 ENCOUNTER — Ambulatory Visit (INDEPENDENT_AMBULATORY_CARE_PROVIDER_SITE_OTHER): Payer: Medicare Other | Admitting: Adult Health

## 2012-01-14 VITALS — BP 138/64 | HR 84 | Temp 97.3°F | Ht 64.0 in | Wt 187.8 lb

## 2012-01-14 DIAGNOSIS — R3129 Other microscopic hematuria: Secondary | ICD-10-CM

## 2012-01-14 DIAGNOSIS — Z0000 Encounter for general adult medical examination without abnormal findings: Secondary | ICD-10-CM

## 2012-01-14 DIAGNOSIS — E119 Type 2 diabetes mellitus without complications: Secondary | ICD-10-CM

## 2012-01-14 DIAGNOSIS — N289 Disorder of kidney and ureter, unspecified: Secondary | ICD-10-CM

## 2012-01-14 DIAGNOSIS — Z23 Encounter for immunization: Secondary | ICD-10-CM

## 2012-01-14 DIAGNOSIS — D126 Benign neoplasm of colon, unspecified: Secondary | ICD-10-CM

## 2012-01-14 DIAGNOSIS — E7800 Pure hypercholesterolemia, unspecified: Secondary | ICD-10-CM

## 2012-01-14 LAB — URINALYSIS, ROUTINE W REFLEX MICROSCOPIC
Bilirubin Urine: NEGATIVE
Ketones, ur: NEGATIVE
Urine Glucose: NEGATIVE
Urobilinogen, UA: 0.2 (ref 0.0–1.0)

## 2012-01-14 NOTE — Patient Instructions (Addendum)
Due for routine colonoscopy - establish with doctor once you are settled in [place].  Pneumovax today  May cut back on Metformin 500mg  Twice daily   Low sweet and cholesterol diet  Avoid NSAIDS - advil/aleve, etc  Follow up 6 months with Dr. Kriste Basque  And As needed

## 2012-01-14 NOTE — Progress Notes (Signed)
Subjective:    Patient ID: Victoria Thornton, female    DOB: 07-Jul-1940, 71 y.o.   MRN: [id number]  HPI 71 y/o WF-. she lives in [place], [place] but still maintains her medical care here in [place];  She has mult medical problems including:  Hx VC polyp & chr hoarseness;  Chronic Bronchitis/ ex-smoker;  MVP;  Hyperchol;  DM;  Overweight;  GERD;  Divertics/ Colon Polyps;  STROKE;  Vit D defic;  Anxiety & Depression...  ~  January 28, 2010:  172mo ROV- feeling well & preparing for trip to [place] (wants Cipro & ZPak)... no bronchitic symptoms, CXR 3/11 NAD, & voice stable;  Chol controlled on diet alone but LDL not at goal & she will continue diet/ exercise/ etc;  BS improved at home w/ MetformBid + Glimep2 & diet (wt down 4#);  she is seeing Ortho in [place]- on Pred5mg /d x30d & this really helped but warned re her DM control;  she didn't stick w/ the Vit D 2000u daily & asked to restart.  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 & was adm to [facility] in [place], [place];  We don't have their direct work-up data yet but she was transferred to [facility] for rehab & I have reviewed all the [facility] data & DrKirsteins summary:  Her initial MRI showed an acute infarct in the post limb of the left internal capsule;  There was a report of an abn Carotid doppler as well (50-79% RICA &16-49% LICA stenoses);  She apparently did not receive Neurology or VascSurg consults;  Several med adjustments made> she agreed to start Simva20, they stopped her Glimepiride, added Aggrenox, added low dose Lisinopril, added Neurontin for a painful right sided neuropathy...  ~  Sep 30, 2010:  79mo ROV & she had VascSurg consult w/ DrEarly 09/24/10 w/ repeat CDopplers showing 40-59% bilat ICA stenoses felt secondary to vessel tortuosity (only mild heterogenous plaque noted) & intimal thickening in the common carotids; he rec CT Angio of the neck vessels ==> no signif extracranial flow reduction...  She also had  right breast lesion on routine mammogram at [facility] 4/12> Bx showed fibrocystic change w/ calcif, no malig...  She has about 2 weeks to go on her rehab> had ?right meralgia pain w/ injections from DrKirstens, also using Voltaren which seems to help;  She tells me she will be returning to [facility] in several weeks...  ~  March 03, 2011:  72mo ROV> she lives at [facility] & brought labs from her primary care for [place] to review==> BS=122, A1c=6.2, TG=189, otherw OK...    Chr Hoarseness> Hx VC polyp- no change in voice, quit smoking ~2001, aphasia from stroke 3/12 has improved w/ therapy...    Hx Chr Bronchitis> min cough, no sputum, chr DOE w/o change, she is encouraged to incr her exercise...    HBP> on Lisinopril 10mg ; BP= 142/78 & even better at home she says; denies CP, palpit, dizzy, ch in SOB, edema, etc...    Chol> on Simva20 + diet; wt is down 11# to 184# now & she is encouraged to keep up the good work; FLP 9/12 from LMD revealed TChol 141, TG 189, HDL 44, LDL 59...    DM> on Metformin 500mg  bid; labs from [facility]MD 9/12 showed BS=122, A1c=6.2; rec to continue same...    Overweight> on diet + exercise; weight today= 184# which is down 12# from last visit!!!  GERD> she uses OTC Prilosec as needed; denies swallowing difficulty etc...    Divertics, Polyp> denies low GI symptoms or problems & f/u colon due from [facility] 7/13...    Hx STROKE> on AGGRENOX; she is followed by Neurology in the [facility] area, pt feels that she's not talking or writing as well s before & will check w/ her specialist...    Vit D defic> on Women's MVI & Vit D 2000u daily...    Hx anxiety/ depression> on Wellbutrin 75mg - 2/d & Ambien 10mg  qhs...   01/14/12 CPX  Returns for yearly CPX  Is moving to [place] to be closer to her daughter.  Says she is doing well overall Does have some gas and bloating and occasional loose stools after eating  No bloody stools or abdominal pain  Did have her metformin [place]  [place]. Stomach symptoms started after this .  Is due for her routine colonoscopy wants to wait until moved in at new place.  No weight loss, chest pain , extremity weakness, or syncope   Reviewed all her labs  Cholestrol was at goal on statin with amazing LD at zocor 20mg   DM was stable with A1C at 7.2 . (last check 7.1)  Rest of labs showed some mild renal insuff. 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 she is an ex-smoker and quit ~2001... seen by DrShoemaker for ENT in 1998. ~  4/12:  She had a left brain stroke 3/12 w/ mild aphasia & some dysphonia;  She passed the swallowing tests in rehab...  Hx of BRONCHITIS, CHRONIC (ICD-491.9) - ex-smoker quit ~2001... she has min cough, no sputum, chr DOE without change, and denies CP, palpit, CHF symptoms, edema, etc... ~  CXR 3/10 showed clear, NAD...  F/u CXR 3/11 is stable, no change... ~  Awaiting records from [facility] for CXR report ==> pending  HBP>  She was noted to have mild HBP in the hosp 4/12 & LISINOPRIL 10mg /d started. ~  5/12:  BP= 112/68 & she feels well- denies CP/ Palpit/ SOB (x deconditioned)/ edema/ etc...  MITRAL VALVE PROLAPSE (ICD-424.0) - clinical dx... never had 2DEcho... intermittent msc heard over the years... no CP, palpit, etc... ~  Awaiting records from [facility] 3/12 to check for 2DEcho==> pending  HYPERCHOLESTEROLEMIA - prev on meds but pt stopped them on her own and is using red yeast rice and OTC fish oil supplements...  ~  FLP 1/08 showed TChol 187, TG 165, HDL 42, LDL 112... Discussed low chol, low fat diets. ~  FLP 1/09 showed TChol 205, TG 132, HDL 46, LDL 141... offered low dose Crestor, she prefers diet. ~  FLP 3/10 showed TChol 195, TG 134, HDL 38, LDL 130 ~  FLP 3/11 showed TChol  194, TG 201, HDL 50, LDL 126 ~  3/12:  Placed on SIMVASTATIN 20mg /d after her stroke... ~  FLP 4/12 on Simva20 (but not really fasting, she says) showed TChol 136, TG 290, HDL 44, LDL 58... Needs better low fat diet & wt reduction.  DIABETES MELLITUS - review of chart show FBS~120-130 range & HgA1c= low6's for several yrs on diet alone...  ~  she is asymptomatic, prev eval by Endocrinologist in  [place], [facility]... ~  labs 1/09  (wt=210#) showed FBS= 136, HgA1c= 6.9.Marland KitchenMarland Kitchen rec to start meds but she prefers diet Rx... ~  labs 3/10 (wt=209#) showed BS= 214, A1c= not done... rec> start METFORMIN 500Bid. ~  labs 9/10 (wt=200#) showed BS= 194, A1c= 7.9.Marland Kitchen. rec> add GLIMEPIRIDE 2mg /d. ~  labs 3/11 (wt=207#) showed BS= 160, A1c= 6.9.Marland KitchenMarland Kitchen continue both meds. ~  labs 9/11 (wt=203#) showed BS= 165, A1c= 6.7.Marland KitchenMarland Kitchen Stable on Metform+Glimep. ~  3/12:  Hosp w/ Stroke & sent to Rehab at [facility]> Glimep stopped & Metform 500Bid continued. ~  Labs 4/12 showed BS= 101 on MetforminBid, continue same. ~  Labs 5/12 showed A1c= 7.1.Marland KitchenMarland Kitchen rec better diet & wt reduction  OVERWEIGHT (ICD-278.02) - not really dieting or exercising by her admission... we discussed diet + exercise program...  GERD (ICD-530.81) - some reflux laryngitis suspected in the past... without prev EGD... she has had increased heartburn symptoms recently and we discussed starting OTC PRILOSEC 20mg /d taken 30 min before dinner (she uses this Prn only).  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 f/u planned 5 yrs.  STROKE > 08/09/10 presented to [facility] in [place], [place] w/ right sided weakness, right facial droop, difficulty walking, & aphasic;  Dx w/ infarct in post limb of the left internal capsule on MRI, & abnormal CDopplers ==> we have requested the hard copy data for our review...  we are [facility] her to [facility] to repeat studies & establish  baseline... ~  5/12:  Pt seen by DrEarly, [facility]> repeat CDopplers showed 40-59% bilat ICA stenoses felt secondary to vessel tortuosity (only mild heterogenous plaque noted) & intimal thickening in the common carotids; he rec CT Angio of the neck vessels ==> pending.  VITAMIN D DEFICIENCY (ICD-268.9) ~  labs 3/11 showed Vit D level = 20... rec> start 2000 u Vit D OTC daily & stay on this.  DYSTHYMIA (ICD-300.4) - hx anxiety and depression in past... followed by [facility] (sees Dr. Jules Schick) and prev on Celexa... prev suicidal gesture w/ Tylenol overdose after divorce from her 3rd husb...  ~  3/10:  she notes increased symptoms of depression and will f/u w DrPittman and psychologist Phylliss Blakes... ~  9/11:  she reports now on Rx w/ [facility] 75mg /d.   Past Surgical History  Procedure Date  . Appendectomy   . Tonsillectomy     Outpatient Encounter Prescriptions as of 01/14/2012  Medication Sig Dispense Refill  . aspirin 325 MG tablet Take 325 mg by mouth daily.      Marland Kitchen buPROPion (WELLBUTRIN) 75 MG tablet Take 150 mg by mouth daily.       . Calcium Carbonate-Vitamin D (CALTRATE 600+D) 600-400 MG-UNIT per tablet Take 1 tablet by mouth 2 (two) times daily.       Marland Kitchen lisinopril (PRINIVIL,ZESTRIL) 10 MG tablet Take 1 tablet (10 mg total) by mouth daily.  90 tablet  3  . metFORMIN (GLUCOPHAGE) 500 MG tablet Take 1,000 mg by mouth 2 (two) times daily with a meal.       . Multiple Vitamins-Minerals (WOMENS DAILY FORMULA PO) Take 1 capsule by mouth daily.        . simvastatin (ZOCOR) 20 MG tablet TAKE 1 TABLET AT BEDTIME  90 tablet  2  . traMADol (ULTRAM) 50 MG tablet Take 1 tablet (50 mg total) by mouth 2 (two) times daily as needed for pain. Take one By mouth  twice daily as needed  100 tablet  2  . zolpidem (AMBIEN) 10 MG tablet Take 0.5 tablets (5 mg total) by mouth at bedtime as needed.  30 tablet  2  . acetaminophen (TYLENOL) 325 MG tablet Take 650 mg by mouth every 4 (four) hours as needed.         . Cholecalciferol (VITAMIN D3) 2000 UNITS TABS Take 1 capsule by mouth daily.        Marland Kitchen DISCONTD: metFORMIN (GLUCOPHAGE) 500 MG tablet TAKE 1 TABLET TWICE A DAY (NEED APPOINTMENT FOR FURTHER REFILLS)  180 tablet  3    Allergies  Allergen Reactions  . Atorvastatin     REACTION: pt just refuses to take Lipitor    Current Medications, Allergies, Past Medical History, Past Surgical History, Family History, and Social History were reviewed in [employer] record.    Review of Systems  Constitutional:  Denies F/C/S, anorexia, unexpected weight change. HEENT:  No HA, visual changes, earache, nasal symptoms, sore throat; +hoarseness chronically. Resp:  No cough, sputum, hemoptysis; no SOB, tightness, wheezing. Cardio:  No CP, palpit, orthopnea, edema;   GI:  Denies N/V/D/C or blood in stool; no reflux, abd pain  GU:  No dysuria, freq, urgency, hematuria, or flank pain. MS:  Denies joint pain, swelling, tenderness; no neck pain, back pain, etc. Neuro:  No tremors, seizures, dizziness, syncope  Skin:  No suspicious lesions or skin rash. Heme:  No adenopathy, bruising, bleeding. Psyche: Denies confusion, sleep disturbance, hallucinations     Objective:  Physical Exam  WD, NAD  GENERAL:  Alert & oriented; pleasant & cooperative...  VS reviewed. HEENT:  Anthony/AT, EOM-wnl, PERRLA, Fundi-benign, EACs-clear, TMs-wnl, NOSE-clear, THROAT-clear & wnl, sl hoarse without change. NECK:  Supple w/ fairROM; no JVD; normal carotid impulses w/o bruits; no thyromegaly or nodules palpated; no lymphadenopathy. BREAST : deferred recent exam from GYN CHEST:  Clear without wheezes, rales, or rhonchi heard... HEART:  Regular Rhythm; without murmurs/ rubs/ or gallops detected... ABDOMEN:  Soft & nontender; normal bowel sounds; no organomegaly or masses palpated... EXT: without deformities, mild arthritic changes; no varicose veins/ venous insuffic/ or edema. NEURO: mild right central  facial paresis; mild right sided weakness & min incoordination...-chronic from prev cva  DERM:  neg- without lesions seen, no rash, etc...   Assessment & Plan:   HPI   Review of Systems   Physical Exam

## 2012-01-16 ENCOUNTER — Telehealth: Payer: Self-pay | Admitting: Pulmonary Disease

## 2012-01-16 DIAGNOSIS — R3129 Other microscopic hematuria: Secondary | ICD-10-CM

## 2012-01-16 NOTE — Telephone Encounter (Signed)
lmomtcb for the pt.  

## 2012-01-20 DIAGNOSIS — N289 Disorder of kidney and ureter, unspecified: Secondary | ICD-10-CM | POA: Insufficient documentation

## 2012-01-20 DIAGNOSIS — R3129 Other microscopic hematuria: Secondary | ICD-10-CM | POA: Insufficient documentation

## 2012-01-20 NOTE — Telephone Encounter (Signed)
Victoria Thornton was calling to inform pt:  Notes Recorded by Julio Sicks, NP on 01/15/2012 at 9:13 AM cxr is clear  And  Notes Recorded by Julio Sicks, NP on 01/15/2012 at 9:16 AM Urine still shows a microscopic hematuria (blood) Will need referral to urology to evaluate  I know she is moving to [facility]  Ask her where she wants the referral sent.   -------  [email or web address]

## 2012-01-20 NOTE — Telephone Encounter (Signed)
Pt is returning triage's call & asked to be reached after 1:00 today at [phone].  Victoria Thornton

## 2012-01-20 NOTE — Assessment & Plan Note (Signed)
Hx of polyps   Advised  Due for routine colonoscopy - establish with doctor once you are settled in [place].

## 2012-01-20 NOTE — Telephone Encounter (Signed)
Pt has called back & asked to be reached at [phone].  Victoria Thornton

## 2012-01-20 NOTE — Assessment & Plan Note (Signed)
Repeat UA shows persistent microscopic hematuria ,  Hx of smoking , will need urology referral

## 2012-01-20 NOTE — Assessment & Plan Note (Signed)
Due for routine colonoscopy - establish with doctor once you are settled in [place].  Pneumovax today   Low sweet and cholesterol diet  Avoid NSAIDS - advil/aleve, etc  Follow up 6 months with Dr. Kriste Basque  And As needed

## 2012-01-20 NOTE — Assessment & Plan Note (Signed)
May cut back on Metformin 500mg  Twice daily  Due to GI symptoms  Low sweet and cholesterol diet  Will need repeat A1C in at next check    Follow up 6 months with Dr. Kriste Basque  And As needed

## 2012-01-20 NOTE — Assessment & Plan Note (Signed)
At goal  If LDL remains this low may consider decreasing to 10mg  daily

## 2012-01-20 NOTE — Telephone Encounter (Signed)
I spoke with patient about results and she verbalized understanding and had no questions. She stated she would like to be referred to a physician in [place]. I have sent order. Nothing further was needed

## 2012-04-30 ENCOUNTER — Other Ambulatory Visit: Payer: Self-pay | Admitting: Pulmonary Disease

## 2012-05-03 MED ORDER — SIMVASTATIN 20 MG PO TABS: 20.0000 mg | ORAL_TABLET | Freq: Every day | ORAL | Status: AC

## 2012-05-03 NOTE — Telephone Encounter (Signed)
Refill has been sent to the pharmacy.  

## 2012-05-04 ENCOUNTER — Telehealth: Payer: Self-pay | Admitting: Pulmonary Disease

## 2012-05-04 MED ORDER — METFORMIN HCL 500 MG PO TABS: 1000.0000 mg | ORAL_TABLET | Freq: Two times a day (BID) | ORAL | Status: AC

## 2012-05-04 NOTE — Telephone Encounter (Signed)
LM for pt that refill for Metformin was sent to [facility].

## 2012-07-05 ENCOUNTER — Ambulatory Visit: Payer: Medicare Other | Admitting: Pulmonary Disease

## 2012-07-19 ENCOUNTER — Ambulatory Visit: Payer: Medicare Other | Admitting: Pulmonary Disease

## 2012-08-06 ENCOUNTER — Ambulatory Visit: Payer: Medicare Other | Admitting: Pulmonary Disease

## 2012-08-19 ENCOUNTER — Encounter: Payer: Self-pay | Admitting: Gastroenterology

## 2012-10-21 ENCOUNTER — Ambulatory Visit: Payer: Medicare Other | Admitting: Neurosurgery

## 2012-10-21 ENCOUNTER — Other Ambulatory Visit: Payer: Medicare Other

## 2014-05-19 DIAGNOSIS — C50919 Malignant neoplasm of unspecified site of unspecified female breast: Secondary | ICD-10-CM

## 2014-05-19 DIAGNOSIS — Z923 Personal history of irradiation: Secondary | ICD-10-CM

## 2014-05-19 HISTORY — DX: Malignant neoplasm of unspecified site of unspecified female breast: C50.919

## 2014-05-19 HISTORY — PX: BREAST LUMPECTOMY: SHX2

## 2014-05-19 HISTORY — DX: Personal history of irradiation: Z92.3

## 2018-03-05 ENCOUNTER — Encounter: Payer: Self-pay | Admitting: Hematology and Oncology

## 2018-03-05 ENCOUNTER — Telehealth: Payer: Self-pay | Admitting: Hematology and Oncology

## 2018-03-05 NOTE — Telephone Encounter (Signed)
New referral received from Dr. Forde Dandy for hx of breast cancer. Pt cld to schedule an appt. She has her oncology records with her. I asked that she brings her records prior to her appt.  She has been scheduled to see Dr. Lindi Adie on 11/4 at 345pm.

## 2018-03-22 ENCOUNTER — Telehealth: Payer: Self-pay | Admitting: Hematology and Oncology

## 2018-03-22 ENCOUNTER — Inpatient Hospital Stay: Payer: Medicare Other | Attending: Hematology and Oncology | Admitting: Hematology and Oncology

## 2018-03-22 VITALS — BP 124/59 | HR 68 | Temp 98.6°F | Resp 17 | Ht 64.0 in | Wt 173.2 lb

## 2018-03-22 DIAGNOSIS — E119 Type 2 diabetes mellitus without complications: Secondary | ICD-10-CM | POA: Diagnosis not present

## 2018-03-22 DIAGNOSIS — Z79899 Other long term (current) drug therapy: Secondary | ICD-10-CM

## 2018-03-22 DIAGNOSIS — Z79811 Long term (current) use of aromatase inhibitors: Secondary | ICD-10-CM | POA: Diagnosis not present

## 2018-03-22 DIAGNOSIS — Z7902 Long term (current) use of antithrombotics/antiplatelets: Secondary | ICD-10-CM

## 2018-03-22 DIAGNOSIS — Z7982 Long term (current) use of aspirin: Secondary | ICD-10-CM | POA: Insufficient documentation

## 2018-03-22 DIAGNOSIS — Z7984 Long term (current) use of oral hypoglycemic drugs: Secondary | ICD-10-CM | POA: Insufficient documentation

## 2018-03-22 DIAGNOSIS — Z170 Estrogen receptor positive status [ER+]: Secondary | ICD-10-CM | POA: Diagnosis not present

## 2018-03-22 DIAGNOSIS — Z87891 Personal history of nicotine dependence: Secondary | ICD-10-CM | POA: Diagnosis not present

## 2018-03-22 DIAGNOSIS — Z923 Personal history of irradiation: Secondary | ICD-10-CM | POA: Insufficient documentation

## 2018-03-22 DIAGNOSIS — Z8042 Family history of malignant neoplasm of prostate: Secondary | ICD-10-CM | POA: Insufficient documentation

## 2018-03-22 DIAGNOSIS — D0511 Intraductal carcinoma in situ of right breast: Secondary | ICD-10-CM | POA: Insufficient documentation

## 2018-03-22 MED ORDER — GLYBURIDE 5 MG PO TABS: 10.0000 mg | ORAL_TABLET | Freq: Every day | ORAL | Status: AC

## 2018-03-22 MED ORDER — TRAZODONE HCL 50 MG PO TABS: 50.0000 mg | ORAL_TABLET | Freq: Every day | ORAL | Status: DC

## 2018-03-22 MED ORDER — ANASTROZOLE 1 MG PO TABS: 1.0000 mg | ORAL_TABLET | Freq: Every day | ORAL | 3 refills | Status: DC

## 2018-03-22 MED ORDER — MAGNESIUM OXIDE 400 (241.3 MG) MG PO TABS: 400.0000 mg | ORAL_TABLET | Freq: Every day | ORAL | Status: AC

## 2018-03-22 MED ORDER — SITAGLIPTIN PHOSPHATE 100 MG PO TABS: 100.0000 mg | ORAL_TABLET | Freq: Every day | ORAL | Status: AC

## 2018-03-22 NOTE — Telephone Encounter (Signed)
Gave pt avs and calendar  °

## 2018-03-22 NOTE — Progress Notes (Signed)
[facility] CONSULT NOTE  Patient Care Team: Reynold Bowen, MD as PCP - General (Endocrinology)  CHIEF COMPLAINTS/PURPOSE OF CONSULTATION:  Newly diagnosed breast cancer  HISTORY OF PRESENTING ILLNESS:  Victoria Thornton 77 y.o. female is here because of prior diagnosis of right breast DCIS.  Patient had a routine screening mammogram that detected calcifications that led to ultrasound and biopsy.  This led to a lumpectomy and reresection for the margins.  All of this treatment was done in [place].  She then subsequently started anastrozole therapy.  She is tolerating it extremely well.  She moved to [facility] because she wanted to be closer to her family.  She is here today to set up oncology care with [place].  She has had no side effects from anastrozole.  She denies any lumps or nodules in the breast.  I reviewed her records extensively and collaborated the history with the patient.  SUMMARY OF ONCOLOGIC HISTORY:   Ductal carcinoma in situ (DCIS) of right breast   07/26/2014 Initial Diagnosis    Stereotactic biopsy right breast 12:00: Intraductal hyperplasia with no atypia, 18 mm group of calcifications 8 o'clock position high-grade DCIS ER 95% PR 60% positive at [facility]    08/09/2014 Surgery    Right lumpectomy followed by reexcision for negative margins    11/01/2014 - 12/01/2014 Radiation Therapy    Adjuvant radiation therapy    12/31/2017 -  Anti-estrogen oral therapy    Anastrozole 1 mg daily      MEDICAL HISTORY:  Past Medical History:  Diagnosis Date  . Chronic bronchitis   . Chronic hoarseness   . Colonic polyp   . Diabetes mellitus   . Diverticulosis of colon (without mention of hemorrhage)   . Dysthymia   . GERD (gastroesophageal reflux disease)   . MVP (mitral valve prolapse)   . Overweight   . Pure hypercholesterolemia   . Stroke March 23,2012  . Unspecified vitamin D deficiency   . Vocal cord polyp     SURGICAL  HISTORY: Past Surgical History:  Procedure Laterality Date  . APPENDECTOMY    . BREAST CYST ASPIRATION  1995  . TONSILLECTOMY  1960  . vocal cord polyps  1998    SOCIAL HISTORY: Social History   Socioeconomic History  . Marital status: Divorced    Spouse name: Not on file  . Number of children: 1  . Years of education: Not on file  . Highest education level: Not on file  Occupational History  . Occupation: Retired    Comment: Data processing manager  . Financial resource strain: Not on file  . Food insecurity:    Worry: Not on file    Inability: Not on file  . Transportation needs:    Medical: Not on file    Non-medical: Not on file  Tobacco Use  . Smoking status: Former Smoker    Packs/day: 1.00    Years: 40.00    Pack years: 40.00    Types: Cigarettes    Last attempt to quit: 05/19/1997    Years since quitting: 20.8  . Smokeless tobacco: Never Used  Substance and Sexual Activity  . Alcohol use: Yes    Comment: social ETOH  . Drug use: No  . Sexual activity: Not on file  Lifestyle  . Physical activity:    Days per week: Not on file    Minutes per session: Not on file  . Stress: Not on file  Relationships  . Social connections:    Talks on phone: Not on file    Gets together: Not on file    Attends religious service: Not on file    Active member of club or organization: Not on file    Attends meetings of clubs or organizations: Not on file    Relationship status: Not on file  . Intimate partner violence:    Fear of current or ex partner: Not on file    Emotionally abused: Not on file    Physically abused: Not on file    Forced sexual activity: Not on file  Other Topics Concern  . Not on file  Social History Narrative  . Not on file    FAMILY HISTORY: Family History  Problem Relation Age of Onset  . Prostate cancer Father   . Heart attack Father   . Heart disease Father        Heart Disease before age 36  . Diabetes Mother   . Depression  Mother   . Hypertension Brother   . Multiple sclerosis Daughter     ALLERGIES:  is allergic to atorvastatin.  MEDICATIONS:  Current Outpatient Medications  Medication Sig Dispense Refill  . anastrozole (ARIMIDEX) 1 MG tablet Take 1 tablet (1 mg total) by mouth daily. 90 tablet 3  . aspirin 325 MG tablet Take 325 mg by mouth daily.    . Calcium Carbonate-Vitamin D (CALTRATE 600+D) 600-400 MG-UNIT per tablet Take 1 tablet by mouth 2 (two) times daily.     . Cholecalciferol (VITAMIN D3) 2000 UNITS TABS Take 1 capsule by mouth daily.      Marland Kitchen glyBURIDE (DIABETA) 5 MG tablet Take 2 tablets (10 mg total) by mouth daily with breakfast.    . lisinopril (PRINIVIL,ZESTRIL) 10 MG tablet Take 1 tablet (10 mg total) by mouth daily. 90 tablet 3  . magnesium oxide (MAG-OX) 400 (241.3 Mg) MG tablet Take 1 tablet (400 mg total) by mouth daily.    . metFORMIN (GLUCOPHAGE) 500 MG tablet Take 2 tablets (1,000 mg total) by mouth 2 (two) times daily with a meal. 180 tablet 1  . Multiple Vitamins-Minerals (WOMENS DAILY FORMULA PO) Take 1 capsule by mouth daily.      . simvastatin (ZOCOR) 20 MG tablet Take 1 tablet (20 mg total) by mouth at bedtime. 90 tablet 0  . sitaGLIPtin (JANUVIA) 100 MG tablet Take 1 tablet (100 mg total) by mouth daily.    . traZODone (DESYREL) 50 MG tablet Take 1 tablet (50 mg total) by mouth at bedtime.    Marland Kitchen zolpidem (AMBIEN) 10 MG tablet Take 0.5 tablets (5 mg total) by mouth at bedtime as needed. 30 tablet 2   No current facility-administered medications for this visit.     REVIEW OF SYSTEMS:   Constitutional: Denies fevers, chills or abnormal night sweats Eyes: Denies blurriness of vision, double vision or watery eyes Ears, nose, mouth, throat, and face: Denies mucositis or sore throat Respiratory: Denies cough, dyspnea or wheezes Cardiovascular: Denies palpitation, chest discomfort or lower extremity swelling Gastrointestinal:  Denies nausea, heartburn or change in bowel  habits Skin: Denies abnormal skin rashes Lymphatics: Denies new lymphadenopathy or easy bruising Neurological:Denies numbness, tingling or new weaknesses Behavioral/Psych: Mood is stable, no new changes  Breast:  Denies any palpable lumps or discharge All other systems were reviewed with the patient and are negative.  PHYSICAL EXAMINATION: ECOG PERFORMANCE STATUS: 0 - Asymptomatic  Vitals:   03/22/18 1604  BP: (!) 124/59  Pulse: 68  Resp: 17  Temp: 98.6 F (37 C)  SpO2: 98%   Filed Weights   03/22/18 1604  Weight: 173 lb 3.2 oz (78.6 kg)    GENERAL:alert, no distress and comfortable SKIN: skin color, texture, turgor are normal, no rashes or significant lesions EYES: normal, conjunctiva are pink and non-injected, sclera clear OROPHARYNX:no exudate, no erythema and lips, buccal mucosa, and tongue normal  NECK: supple, thyroid normal size, non-tender, without nodularity LYMPH:  no palpable lymphadenopathy in the cervical, axillary or inguinal LUNGS: clear to auscultation and percussion with normal breathing effort HEART: regular rate & rhythm and no murmurs and no lower extremity edema ABDOMEN:abdomen soft, non-tender and normal bowel sounds Musculoskeletal:no cyanosis of digits and no clubbing  PSYCH: alert & oriented x 3 with fluent speech NEURO: no focal motor/sensory deficits BREAST: No palpable nodules in breast. No palpable axillary or supraclavicular lymphadenopathy (exam performed in the presence of a chaperone)   LABORATORY DATA:  I have reviewed the data as listed Lab Results  Component Value Date   WBC 9.2 01/12/2012   HGB 12.3 01/12/2012   HCT 37.5 01/12/2012   MCV 89.4 01/12/2012   PLT 276.0 01/12/2012   Lab Results  Component Value Date   NA 138 01/12/2012   K 4.6 01/12/2012   CL 101 01/12/2012   CO2 27 01/12/2012    RADIOGRAPHIC STUDIES: I have personally reviewed the radiological reports and agreed with the findings in the report.  ASSESSMENT  AND PLAN:  Ductal carcinoma in situ (DCIS) of right breast 07/26/2014:Stereotactic biopsy right breast 12:00: Intraductal hyperplasia with no atypia, 18 mm group of calcifications 8 o'clock position high-grade DCIS ER 95% PR 60% positive at [facility] Status post breast conserving surgery followed by adjuvant radiation  Current treatment: Anastrozole 1 mg daily started September 2016 Anastrozole toxicities: Patient does not have any side effects to anastrozole.  I renewed her prescription for anastrozole. Breast cancer surveillance: Mammograms will need to be done in December 2019 I set her up at the breast center.  Return to clinic in 1 year for follow-up   All questions were answered. The patient knows to call the clinic with any problems, questions or concerns.    Harriette Ohara, MD 03/22/18

## 2018-03-22 NOTE — Assessment & Plan Note (Signed)
07/26/2014:Stereotactic biopsy right breast 12:00: Intraductal hyperplasia with no atypia, 18 mm group of calcifications 8 o'clock position high-grade DCIS ER 95% PR 60% positive at [facility] [place] Status post breast conserving surgery followed by adjuvant radiation  Current treatment: Anastrozole 1 mg daily started September 2016 Anastrozole toxicities: Patient does not have any side effects to anastrozole.  I renewed her prescription for anastrozole. Breast cancer surveillance: Mammograms will need to be done in December 2019 I set her up at the breast center.  Return to clinic in 1 year for follow-up

## 2018-05-06 ENCOUNTER — Ambulatory Visit
Admission: RE | Admit: 2018-05-06 | Discharge: 2018-05-06 | Disposition: A | Discharge status: Home / Self Care | Payer: Medicare Other | Source: Ambulatory Visit | Attending: Hematology and Oncology | Admitting: Hematology and Oncology

## 2018-05-06 DIAGNOSIS — D0511 Intraductal carcinoma in situ of right breast: Secondary | ICD-10-CM

## 2018-05-06 HISTORY — DX: Personal history of irradiation: Z92.3

## 2018-05-06 HISTORY — DX: Malignant neoplasm of unspecified site of unspecified female breast: C50.919

## 2018-11-24 ENCOUNTER — Other Ambulatory Visit: Payer: Self-pay | Admitting: Orthopedic Surgery

## 2018-11-24 DIAGNOSIS — M67911 Unspecified disorder of synovium and tendon, right shoulder: Secondary | ICD-10-CM

## 2018-12-18 ENCOUNTER — Ambulatory Visit
Admission: RE | Admit: 2018-12-18 | Discharge: 2018-12-18 | Disposition: A | Discharge status: Home / Self Care | Payer: Medicare Other | Source: Ambulatory Visit | Attending: Orthopedic Surgery | Admitting: Orthopedic Surgery

## 2018-12-18 ENCOUNTER — Other Ambulatory Visit: Payer: Self-pay

## 2018-12-18 DIAGNOSIS — M67911 Unspecified disorder of synovium and tendon, right shoulder: Secondary | ICD-10-CM

## 2019-02-28 ENCOUNTER — Encounter (HOSPITAL_BASED_OUTPATIENT_CLINIC_OR_DEPARTMENT_OTHER): Payer: Self-pay | Admitting: *Deleted

## 2019-02-28 ENCOUNTER — Other Ambulatory Visit: Payer: Self-pay

## 2019-02-28 ENCOUNTER — Other Ambulatory Visit: Payer: Self-pay | Admitting: Orthopedic Surgery

## 2019-03-01 ENCOUNTER — Encounter (HOSPITAL_BASED_OUTPATIENT_CLINIC_OR_DEPARTMENT_OTHER)
Admission: RE | Admit: 2019-03-01 | Discharge: 2019-03-01 | Disposition: A | Discharge status: Home / Self Care | Payer: Medicare Other | Source: Ambulatory Visit | Attending: Orthopedic Surgery | Admitting: Orthopedic Surgery

## 2019-03-01 ENCOUNTER — Other Ambulatory Visit: Payer: Self-pay | Admitting: Orthopedic Surgery

## 2019-03-01 ENCOUNTER — Other Ambulatory Visit (HOSPITAL_COMMUNITY)
Admission: RE | Admit: 2019-03-01 | Discharge: 2019-03-01 | Disposition: A | Discharge status: Home / Self Care | Payer: Medicare Other | Source: Ambulatory Visit | Attending: Orthopedic Surgery | Admitting: Orthopedic Surgery

## 2019-03-01 DIAGNOSIS — R0602 Shortness of breath: Secondary | ICD-10-CM | POA: Diagnosis not present

## 2019-03-01 DIAGNOSIS — J9601 Acute respiratory failure with hypoxia: Secondary | ICD-10-CM | POA: Diagnosis not present

## 2019-03-01 DIAGNOSIS — Z20828 Contact with and (suspected) exposure to other viral communicable diseases: Secondary | ICD-10-CM | POA: Insufficient documentation

## 2019-03-01 DIAGNOSIS — Z01812 Encounter for preprocedural laboratory examination: Secondary | ICD-10-CM | POA: Insufficient documentation

## 2019-03-01 LAB — BASIC METABOLIC PANEL
Anion gap: 11 (ref 5–15)
BUN: 15 mg/dL (ref 8–23)
CO2: 26 mmol/L (ref 22–32)
Calcium: 9.6 mg/dL (ref 8.9–10.3)
Chloride: 101 mmol/L (ref 98–111)
Creatinine, Ser: 0.94 mg/dL (ref 0.44–1.00)
GFR calc Af Amer: >60 mL/min (ref >60)
GFR calc non Af Amer: 58 mL/min — ABNORMAL LOW (ref >60)
Glucose, Bld: 149 mg/dL — ABNORMAL HIGH (ref 70–99)
Potassium: 4.3 mmol/L (ref 3.5–5.1)
Sodium: 138 mmol/L (ref 135–145)

## 2019-03-01 LAB — SARS CORONAVIRUS 2 (TAT 6-24 HRS): SARS Coronavirus 2: NEGATIVE

## 2019-03-02 ENCOUNTER — Ambulatory Visit (HOSPITAL_BASED_OUTPATIENT_CLINIC_OR_DEPARTMENT_OTHER): Payer: Medicare Other | Admitting: Anesthesiology

## 2019-03-02 ENCOUNTER — Encounter (HOSPITAL_BASED_OUTPATIENT_CLINIC_OR_DEPARTMENT_OTHER)
Admission: RE | Disposition: A | Discharge status: Home / Self Care | Payer: Self-pay | Source: Home / Self Care | Attending: Orthopedic Surgery

## 2019-03-02 ENCOUNTER — Encounter (HOSPITAL_BASED_OUTPATIENT_CLINIC_OR_DEPARTMENT_OTHER): Payer: Self-pay | Admitting: *Deleted

## 2019-03-02 ENCOUNTER — Ambulatory Visit (HOSPITAL_BASED_OUTPATIENT_CLINIC_OR_DEPARTMENT_OTHER)
Admission: RE | Admit: 2019-03-02 | Discharge: 2019-03-02 | Disposition: A | Discharge status: Home / Self Care | Payer: Medicare Other | Source: Home / Self Care | Attending: Orthopedic Surgery | Admitting: Orthopedic Surgery

## 2019-03-02 DIAGNOSIS — S2241XA Multiple fractures of ribs, right side, initial encounter for closed fracture: Secondary | ICD-10-CM | POA: Diagnosis not present

## 2019-03-02 DIAGNOSIS — M75121 Complete rotator cuff tear or rupture of right shoulder, not specified as traumatic: Secondary | ICD-10-CM

## 2019-03-02 DIAGNOSIS — J9601 Acute respiratory failure with hypoxia: Secondary | ICD-10-CM | POA: Diagnosis not present

## 2019-03-02 DIAGNOSIS — J690 Pneumonitis due to inhalation of food and vomit: Secondary | ICD-10-CM | POA: Diagnosis not present

## 2019-03-02 DIAGNOSIS — Z20828 Contact with and (suspected) exposure to other viral communicable diseases: Secondary | ICD-10-CM | POA: Diagnosis not present

## 2019-03-02 HISTORY — PX: SHOULDER ARTHROSCOPY WITH OPEN ROTATOR CUFF REPAIR AND DISTAL CLAVICLE ACROMINECTOMY: SHX5683

## 2019-03-02 LAB — GLUCOSE, CAPILLARY
Glucose-Capillary: 134 mg/dL — ABNORMAL HIGH (ref 70–99)
Glucose-Capillary: 112 mg/dL — ABNORMAL HIGH (ref 70–99)

## 2019-03-02 SURGERY — SHOULDER ARTHROSCOPY WITH OPEN ROTATOR CUFF REPAIR AND DISTAL CLAVICLE ACROMINECTOMY
Anesthesia: General | Site: Shoulder | Laterality: Right

## 2019-03-02 MED ORDER — FENTANYL CITRATE (PF) 100 MCG/2ML IJ SOLN
INTRAMUSCULAR | Status: AC
  Filled 2019-03-02: qty 2

## 2019-03-02 MED ORDER — BUPIVACAINE LIPOSOME 1.3 % IJ SUSP
INTRAMUSCULAR | Status: DC | PRN
  Administered 2019-03-02: 10 mL via PERINEURAL

## 2019-03-02 MED ORDER — GLYCOPYRROLATE PF 0.2 MG/ML IJ SOSY
PREFILLED_SYRINGE | INTRAMUSCULAR | Status: AC
  Filled 2019-03-02: qty 1

## 2019-03-02 MED ORDER — PROPOFOL 500 MG/50ML IV EMUL
INTRAVENOUS | Status: AC
  Filled 2019-03-02: qty 100

## 2019-03-02 MED ORDER — LIDOCAINE 2% (20 MG/ML) 5 ML SYRINGE
INTRAMUSCULAR | Status: AC
  Filled 2019-03-02: qty 5

## 2019-03-02 MED ORDER — PROPOFOL 10 MG/ML IV BOLUS
INTRAVENOUS | Status: AC
  Filled 2019-03-02: qty 20

## 2019-03-02 MED ORDER — MIDAZOLAM HCL 2 MG/2ML IJ SOLN
INTRAMUSCULAR | Status: AC
  Filled 2019-03-02: qty 2

## 2019-03-02 MED ORDER — OXYCODONE HCL 5 MG PO TABS
ORAL_TABLET | ORAL | Status: AC
  Filled 2019-03-02: qty 1

## 2019-03-02 MED ORDER — GLYCOPYRROLATE 0.2 MG/ML IJ SOLN
INTRAMUSCULAR | Status: DC | PRN
  Administered 2019-03-02: 0.1 mg via INTRAVENOUS

## 2019-03-02 MED ORDER — FENTANYL CITRATE (PF) 100 MCG/2ML IJ SOLN
50.0000 ug | INTRAMUSCULAR | Status: DC | PRN
  Administered 2019-03-02: 75 ug via INTRAVENOUS
  Administered 2019-03-02: 50 ug via INTRAVENOUS

## 2019-03-02 MED ORDER — PROPOFOL 10 MG/ML IV BOLUS
INTRAVENOUS | Status: DC | PRN
  Administered 2019-03-02: 80 mg via INTRAVENOUS

## 2019-03-02 MED ORDER — PROMETHAZINE HCL 25 MG/ML IJ SOLN: 6.2500 mg | INTRAMUSCULAR | Status: DC | PRN

## 2019-03-02 MED ORDER — BUPIVACAINE HCL (PF) 0.5 % IJ SOLN
INTRAMUSCULAR | Status: DC | PRN
  Administered 2019-03-02: 15 mL via PERINEURAL

## 2019-03-02 MED ORDER — CEFAZOLIN SODIUM-DEXTROSE 2-4 GM/100ML-% IV SOLN
2.0000 g | INTRAVENOUS | Status: AC
  Administered 2019-03-02: 2 g via INTRAVENOUS

## 2019-03-02 MED ORDER — CEFAZOLIN SODIUM-DEXTROSE 2-4 GM/100ML-% IV SOLN
INTRAVENOUS | Status: AC
  Filled 2019-03-02: qty 100

## 2019-03-02 MED ORDER — HYDROCODONE-ACETAMINOPHEN 5-325 MG PO TABS: 1.0000 | ORAL_TABLET | Freq: Four times a day (QID) | ORAL | 0 refills | Status: DC | PRN

## 2019-03-02 MED ORDER — POVIDONE-IODINE 10 % EX SWAB: 2.0000 "application " | Freq: Once | CUTANEOUS | Status: DC

## 2019-03-02 MED ORDER — EPHEDRINE SULFATE 50 MG/ML IJ SOLN
INTRAMUSCULAR | Status: DC | PRN
  Administered 2019-03-02: 5 mg via INTRAVENOUS

## 2019-03-02 MED ORDER — DEXAMETHASONE SODIUM PHOSPHATE 10 MG/ML IJ SOLN
INTRAMUSCULAR | Status: AC
  Filled 2019-03-02: qty 1

## 2019-03-02 MED ORDER — ROCURONIUM BROMIDE 10 MG/ML (PF) SYRINGE
PREFILLED_SYRINGE | INTRAVENOUS | Status: AC
  Filled 2019-03-02: qty 10

## 2019-03-02 MED ORDER — KETOROLAC TROMETHAMINE 30 MG/ML IJ SOLN
INTRAMUSCULAR | Status: AC
  Filled 2019-03-02: qty 1

## 2019-03-02 MED ORDER — SODIUM CHLORIDE 0.9 % IV SOLN
INTRAVENOUS | Status: DC | PRN
  Administered 2019-03-02: 30 ug/min via INTRAVENOUS

## 2019-03-02 MED ORDER — FENTANYL CITRATE (PF) 100 MCG/2ML IJ SOLN
25.0000 ug | INTRAMUSCULAR | Status: DC | PRN
  Administered 2019-03-02: 25 ug via INTRAVENOUS

## 2019-03-02 MED ORDER — LACTATED RINGERS IV SOLN
INTRAVENOUS | Status: DC
  Administered 2019-03-02 (×2): via INTRAVENOUS

## 2019-03-02 MED ORDER — ONDANSETRON HCL 4 MG/2ML IJ SOLN
INTRAMUSCULAR | Status: DC | PRN
  Administered 2019-03-02: 4 mg via INTRAVENOUS

## 2019-03-02 MED ORDER — ROCURONIUM BROMIDE 10 MG/ML (PF) SYRINGE
PREFILLED_SYRINGE | INTRAVENOUS | Status: DC | PRN
  Administered 2019-03-02: 50 mg via INTRAVENOUS

## 2019-03-02 MED ORDER — PHENYLEPHRINE 40 MCG/ML (10ML) SYRINGE FOR IV PUSH (FOR BLOOD PRESSURE SUPPORT)
PREFILLED_SYRINGE | INTRAVENOUS | Status: DC | PRN
  Administered 2019-03-02: 40 ug via INTRAVENOUS

## 2019-03-02 MED ORDER — MIDAZOLAM HCL 2 MG/2ML IJ SOLN: 1.0000 mg | INTRAMUSCULAR | Status: DC | PRN

## 2019-03-02 MED ORDER — SUGAMMADEX SODIUM 200 MG/2ML IV SOLN
INTRAVENOUS | Status: DC | PRN
  Administered 2019-03-02: 160 mg via INTRAVENOUS

## 2019-03-02 MED ORDER — OXYCODONE HCL 5 MG PO TABS
5.0000 mg | ORAL_TABLET | Freq: Once | ORAL | Status: AC
  Administered 2019-03-02: 5 mg via ORAL

## 2019-03-02 MED ORDER — LIDOCAINE 2% (20 MG/ML) 5 ML SYRINGE
INTRAMUSCULAR | Status: DC | PRN
  Administered 2019-03-02: 80 mg via INTRAVENOUS

## 2019-03-02 MED ORDER — DEXAMETHASONE SODIUM PHOSPHATE 10 MG/ML IJ SOLN
INTRAMUSCULAR | Status: DC | PRN
  Administered 2019-03-02: 4 mg via INTRAVENOUS

## 2019-03-02 MED ORDER — ONDANSETRON HCL 4 MG/2ML IJ SOLN
INTRAMUSCULAR | Status: AC
  Filled 2019-03-02: qty 2

## 2019-03-02 MED ORDER — ENSURE PRE-SURGERY PO LIQD: 296.0000 mL | Freq: Once | ORAL | Status: DC

## 2019-03-02 MED ORDER — CHLORHEXIDINE GLUCONATE 4 % EX LIQD: 60.0000 mL | Freq: Once | CUTANEOUS | Status: DC

## 2019-03-02 MED ORDER — TIZANIDINE HCL 2 MG PO TABS: 2.0000 mg | ORAL_TABLET | Freq: Three times a day (TID) | ORAL | 0 refills | Status: DC | PRN

## 2019-03-02 SURGICAL SUPPLY — 80 items
AID PSTN UNV HD RSTRNT DISP (MISCELLANEOUS) ×2
APL SKNCLS STERI-STRIP NONHPOA (GAUZE/BANDAGES/DRESSINGS) ×2
BENZOIN TINCTURE PRP APPL 2/3 (GAUZE/BANDAGES/DRESSINGS) ×4 IMPLANT
BLADE SURG 15 STRL LF DISP TIS (BLADE) IMPLANT
BLADE SURG 15 STRL SS (BLADE)
BLADE VORTEX 6.0 (BLADE) ×4 IMPLANT
BUR VERTEX HOODED 4.5 (BURR) IMPLANT
BURR OVAL 8 FLU 5.0MM X 13CM (MISCELLANEOUS)
BURR OVAL 8 FLU 5.0X13 (MISCELLANEOUS) IMPLANT
CLOSURE WOUND 1/2 X4 (GAUZE/BANDAGES/DRESSINGS)
CLOSURE WOUND 1/4X4 (GAUZE/BANDAGES/DRESSINGS) ×1
COVER WAND RF STERILE (DRAPES) IMPLANT
DECANTER SPIKE VIAL GLASS SM (MISCELLANEOUS) IMPLANT
DRAPE INCISE IOBAN 66X45 STRL (DRAPES) ×4 IMPLANT
DRAPE STERI 35X30 U-POUCH (DRAPES) ×4 IMPLANT
DRAPE SURG 17X23 STRL (DRAPES) ×4 IMPLANT
DRAPE U-SHAPE 47X51 STRL (DRAPES) ×4 IMPLANT
DRAPE U-SHAPE 76X120 STRL (DRAPES) ×8 IMPLANT
DRSG EMULSION OIL 3X3 NADH (GAUZE/BANDAGES/DRESSINGS) ×4 IMPLANT
DRSG PAD ABDOMINAL 8X10 ST (GAUZE/BANDAGES/DRESSINGS) ×8 IMPLANT
DURAPREP 26ML APPLICATOR (WOUND CARE) ×4 IMPLANT
DW OUTFLOW CASSETTE/TUBE SET (MISCELLANEOUS) ×4 IMPLANT
ELECT REM PT RETURN 9FT ADLT (ELECTROSURGICAL) ×4
ELECTRODE REM PT RTRN 9FT ADLT (ELECTROSURGICAL) ×2 IMPLANT
GAUZE SPONGE 4X4 12PLY STRL (GAUZE/BANDAGES/DRESSINGS) ×4 IMPLANT
GLOVE BIO SURGEON STRL SZ 6.5 (GLOVE) ×3 IMPLANT
GLOVE BIO SURGEON STRL SZ7 (GLOVE) ×4 IMPLANT
GLOVE BIO SURGEONS STRL SZ 6.5 (GLOVE) ×1
GLOVE BIOGEL PI IND STRL 7.0 (GLOVE) ×4 IMPLANT
GLOVE BIOGEL PI IND STRL 8 (GLOVE) ×6 IMPLANT
GLOVE BIOGEL PI INDICATOR 7.0 (GLOVE) ×4
GLOVE BIOGEL PI INDICATOR 8 (GLOVE) ×6
GLOVE ECLIPSE 7.5 STRL STRAW (GLOVE) ×8 IMPLANT
GOWN STRL REUS W/ TWL LRG LVL3 (GOWN DISPOSABLE) ×2 IMPLANT
GOWN STRL REUS W/ TWL XL LVL3 (GOWN DISPOSABLE) ×4 IMPLANT
GOWN STRL REUS W/TWL LRG LVL3 (GOWN DISPOSABLE) ×4
GOWN STRL REUS W/TWL XL LVL3 (GOWN DISPOSABLE) ×6
IMPL SPEEDBRIDGE KIT (Orthopedic Implant) ×2 IMPLANT
IMPLANT SPEEDBRIDGE KIT (Orthopedic Implant) ×4 IMPLANT
MANIFOLD NEPTUNE II (INSTRUMENTS) ×4 IMPLANT
NDL SUT 6 .5 CRC .975X.05 MAYO (NEEDLE) IMPLANT
NEEDLE 1/2 CIR CATGUT .05X1.09 (NEEDLE) IMPLANT
NEEDLE HYPO 18GX1.5 BLUNT FILL (NEEDLE) ×4 IMPLANT
NEEDLE MAYO TAPER (NEEDLE)
NS IRRIG 1000ML POUR BTL (IV SOLUTION) IMPLANT
PACK ARTHROSCOPY DSU (CUSTOM PROCEDURE TRAY) ×4 IMPLANT
PACK BASIN DAY SURGERY FS (CUSTOM PROCEDURE TRAY) ×4 IMPLANT
PASSER SUT SWANSON 36MM LOOP (INSTRUMENTS) IMPLANT
PENCIL BUTTON HOLSTER BLD 10FT (ELECTRODE) IMPLANT
PORT APPOLLO RF 90DEGREE MULTI (SURGICAL WAND) ×4 IMPLANT
RESTRAINT HEAD UNIVERSAL NS (MISCELLANEOUS) ×4 IMPLANT
SET IRRIG Y TYPE TUR BLADDER L (SET/KITS/TRAYS/PACK) ×4 IMPLANT
SLING ARM FOAM STRAP LRG (SOFTGOODS) IMPLANT
SLING ARM FOAM STRAP MED (SOFTGOODS) ×4 IMPLANT
SPONGE LAP 4X18 RFD (DISPOSABLE) IMPLANT
STRIP CLOSURE SKIN 1/2X4 (GAUZE/BANDAGES/DRESSINGS) IMPLANT
STRIP CLOSURE SKIN 1/4X4 (GAUZE/BANDAGES/DRESSINGS) ×3 IMPLANT
SUCTION FRAZIER HANDLE 10FR (MISCELLANEOUS)
SUCTION TUBE FRAZIER 10FR DISP (MISCELLANEOUS) IMPLANT
SUT ETHIBOND 2 OS 4 DA (SUTURE) IMPLANT
SUT ETHILON 4 0 PS 2 18 (SUTURE) IMPLANT
SUT MNCRL AB 3-0 PS2 18 (SUTURE) IMPLANT
SUT TICRON 1 T 12 (SUTURE) IMPLANT
SUT VIC AB 0 CT1 27 (SUTURE)
SUT VIC AB 0 CT1 27XBRD ANBCTR (SUTURE) IMPLANT
SUT VIC AB 1 CT1 27 (SUTURE) ×4
SUT VIC AB 1 CT1 27XBRD ANBCTR (SUTURE) ×2 IMPLANT
SUT VIC AB 2-0 CT1 27 (SUTURE)
SUT VIC AB 2-0 CT1 TAPERPNT 27 (SUTURE) IMPLANT
SUT VIC AB 2-0 SH 27 (SUTURE)
SUT VIC AB 2-0 SH 27XBRD (SUTURE) IMPLANT
SUTURE TAPE 1.3 40 TPR END (SUTURE) ×2 IMPLANT
SUTURETAPE 1.3 40 TPR END (SUTURE) ×4
SYR 5ML LL (SYRINGE) ×4 IMPLANT
TAPE FIBER 2MM 7IN #2 BLUE (SUTURE) IMPLANT
TOWEL GREEN STERILE FF (TOWEL DISPOSABLE) ×4 IMPLANT
TUBE CONNECTING 20'X1/4 (TUBING) ×1
TUBE CONNECTING 20X1/4 (TUBING) ×3 IMPLANT
TUBING ARTHROSCOPY IRRIG 16FT (MISCELLANEOUS) ×4 IMPLANT
YANKAUER SUCT BULB TIP NO VENT (SUCTIONS) IMPLANT

## 2019-03-02 NOTE — Anesthesia Preprocedure Evaluation (Signed)
Anesthesia Evaluation  Patient identified by MRN, date of birth, ID band Patient awake    Reviewed: Allergy & Precautions, NPO status , Patient's Chart, lab work & pertinent test results  Airway Mallampati: II  TM Distance: >3 FB Neck ROM: Full    Dental no notable dental hx.    Pulmonary neg pulmonary ROS, former smoker,    Pulmonary exam normal breath sounds clear to auscultation       Cardiovascular negative cardio ROS Normal cardiovascular exam Rhythm:Regular Rate:Normal     Neuro/Psych CVA negative psych ROS   GI/Hepatic Neg liver ROS, GERD  ,  Endo/Other  diabetes  Renal/GU negative Renal ROS  negative genitourinary   Musculoskeletal negative musculoskeletal ROS (+)   Abdominal   Peds negative pediatric ROS (+)  Hematology negative hematology ROS (+)   Anesthesia Other Findings   Reproductive/Obstetrics negative OB ROS                             Anesthesia Physical Anesthesia Plan  ASA: III  Anesthesia Plan: General   Post-op Pain Management:  Regional for Post-op pain   Induction: Intravenous  PONV Risk Score and Plan: 3 and Ondansetron, Dexamethasone and Treatment may vary due to age or medical condition  Airway Management Planned: Oral ETT  Additional Equipment:   Intra-op Plan:   Post-operative Plan: Extubation in OR  Informed Consent: I have reviewed the patients History and Physical, chart, labs and discussed the procedure including the risks, benefits and alternatives for the proposed anesthesia with the patient or authorized representative who has indicated his/her understanding and acceptance.     Dental advisory given  Plan Discussed with: CRNA and Surgeon  Anesthesia Plan Comments:         Anesthesia Quick Evaluation

## 2019-03-02 NOTE — Discharge Instructions (Signed)
Information for Discharge Teaching: EXPAREL (bupivacaine liposome injectable suspension)   Your surgeon or anesthesiologist gave you EXPAREL(bupivacaine) to help control your pain after surgery.   EXPAREL is a local anesthetic that provides pain relief by numbing the tissue around the surgical site.  EXPAREL is designed to release pain medication over time and can control pain for up to 72 hours.  Depending on how you respond to EXPAREL, you may require less pain medication during your recovery.  Possible side effects:  Temporary loss of sensation or ability to move in the area where bupivacaine was injected.  Nausea, vomiting, constipation  Rarely, numbness and tingling in your mouth or lips, lightheadedness, or anxiety may occur.  Call your doctor right away if you think you may be experiencing any of these sensations, or if you have other questions regarding possible side effects.  Follow all other discharge instructions given to you by your surgeon or nurse. Eat a healthy diet and drink plenty of water or other fluids.  If you return to the hospital for any reason within 96 hours following the administration of EXPAREL, it is important for health care providers to know that you have received this anesthetic. A teal colored band has been placed on your arm with the date, time and amount of EXPAREL you have received in order to alert and inform your health care providers. Please leave this armband in place for the full 96 hours following administration, and then you may remove the band.   Discharge Instructions after  Shoulder Repair   A sling has been provided for you. Remain in your sling at all times. This includes sleeping in your sling.  Use ice on the shoulder intermittently over the first 48 hours after surgery.  Pain medicine has been prescribed for you.  Use your medicine liberally over the first 48 hours, and then you can begin to taper your use. You may take Extra  Strength Tylenol or Tylenol only in place of the pain pills. DO NOT take ANY nonsteroidal anti-inflammatory pain medications: Advil, Motrin, Ibuprofen, Aleve, Naproxen, or Narprosyn.  You may remove your dressing after two days. If the incision sites are still moist, place a Band-Aid over the moist site(s). Change Band-Aids daily until dry.  You may shower 5 days after surgery. The incisions CANNOT get wet prior to 5 days. Simply allow the water to wash over the site and then pat dry. Do not rub the incisions. Make sure your axilla (armpit) is completely dry after showering.  Take one aspirin a day for 2 weeks after surgery, unless you have an aspirin sensitivity/ allergy or asthma.   Please call [phone] during normal business hours or [phone] after hours for any problems. Including the following:  - excessive redness of the incisions - drainage for more than 4 days - fever of more than 101.5 F  *Please note that pain medications will not be refilled after hours or on weekends.   Post Anesthesia Home Care Instructions  Activity: Get plenty of rest for the remainder of the day. A responsible individual must stay with you for 24 hours following the procedure.  For the next 24 hours, DO NOT: -Drive a car -Paediatric nurse -Drink alcoholic beverages -Take any medication unless instructed by your physician -Make any legal decisions or sign important papers.  Meals: Start with liquid foods such as gelatin or soup. Progress to regular foods as tolerated. Avoid greasy, spicy, heavy foods. If nausea and/or vomiting occur, drink only clear  liquids until the nausea and/or vomiting subsides. Call your physician if vomiting continues.  Special Instructions/Symptoms: Your throat may feel dry or sore from the anesthesia or the breathing tube placed in your throat during surgery. If this causes discomfort, gargle with warm salt water. The discomfort should disappear within 24 hours.  If  you had a scopolamine patch placed behind your ear for the management of post- operative nausea and/or vomiting:  1. The medication in the patch is effective for 72 hours, after which it should be removed.  Wrap patch in a tissue and discard in the trash. Wash hands thoroughly with soap and water. 2. You may remove the patch earlier than 72 hours if you experience unpleasant side effects which may include dry mouth, dizziness or visual disturbances. 3. Avoid touching the patch. Wash your hands with soap and water after contact with the patch.    Regional Anesthesia Blocks  1. Numbness or the inability to move the "blocked" extremity may last from 3-48 hours after placement. The length of time depends on the medication injected and your individual response to the medication. If the numbness is not going away after 48 hours, call your surgeon.  2. The extremity that is blocked will need to be protected until the numbness is gone and the  Strength has returned. Because you cannot feel it, you will need to take extra care to avoid injury. Because it may be weak, you may have difficulty moving it or using it. You may not know what position it is in without looking at it while the block is in effect.  3. For blocks in the legs and feet, returning to weight bearing and walking needs to be done carefully. You will need to wait until the numbness is entirely gone and the strength has returned. You should be able to move your leg and foot normally before you try and bear weight or walk. You will need someone to be with you when you first try to ensure you do not fall and possibly risk injury.  4. Bruising and tenderness at the needle site are common side effects and will resolve in a few days.  5. Persistent numbness or new problems with movement should be communicated to the surgeon or the [place] [phone] [place] [phone]).

## 2019-03-02 NOTE — H&P (Signed)
A pre op hand p   Chief Complaint: Right shoulder pain  HPI: Victoria Thornton is a 78 y.o. female who presents for evaluation of right shoulder pain. It has been present for greater than 3 months and has been worsening. She has failed conservative measures. Pain is rated as moderate.  Past Medical History:  Diagnosis Date  . Breast cancer ([place])   . Chronic bronchitis   . Chronic hoarseness   . Colonic polyp   . Diabetes mellitus   . Diverticulosis of colon (without mention of hemorrhage)   . Dysthymia   . GERD (gastroesophageal reflux disease)   . MVP (mitral valve prolapse)   . Overweight(278.02)   . Personal history of radiation therapy   . Pure hypercholesterolemia   . Stroke [facility]) March 23,2012  . Unspecified vitamin D deficiency   . Vocal cord polyp    Past Surgical History:  Procedure Laterality Date  . APPENDECTOMY    . BREAST BIOPSY    . BREAST CYST ASPIRATION  1995  . BREAST EXCISIONAL BIOPSY    . BREAST LUMPECTOMY Right    2016  . TONSILLECTOMY  1960  . vocal cord polyps  1998   Social History   Socioeconomic History  . Marital status: Divorced    Spouse name: Not on file  . Number of children: 1  . Years of education: Not on file  . Highest education level: Not on file  Occupational History  . Occupation: Retired    Comment: Data processing manager  . Financial resource strain: Not on file  . Food insecurity    Worry: Not on file    Inability: Not on file  . Transportation needs    Medical: Not on file    Non-medical: Not on file  Tobacco Use  . Smoking status: Former Smoker    Packs/day: 1.00    Years: 40.00    Pack years: 40.00    Types: Cigarettes    Quit date: 05/19/1997    Years since quitting: 21.8  . Smokeless tobacco: Never Used  Substance and Sexual Activity  . Alcohol use: Yes    Comment: social ETOH  . Drug use: No  . Sexual activity: Not on file  Lifestyle  . Physical activity    Days per week: Not on file    Minutes  per session: Not on file  . Stress: Not on file  Relationships  . Social Herbalist on phone: Not on file    Gets together: Not on file    Attends religious service: Not on file    Active member of club or organization: Not on file    Attends meetings of clubs or organizations: Not on file    Relationship status: Not on file  Other Topics Concern  . Not on file  Social History Narrative  . Not on file   Family History  Problem Relation Age of Onset  . Prostate cancer Father   . Heart attack Father   . Heart disease Father        Heart Disease before age 5  . Diabetes Mother   . Depression Mother   . Hypertension Brother   . Multiple sclerosis Daughter    No Known Allergies Prior to Admission medications   Medication Sig Start Date End Date Taking? Authorizing Provider  anastrozole (ARIMIDEX) 1 MG tablet Take 1 tablet (1 mg total) by mouth daily. 03/22/18  Yes Nicholas Lose, MD  Calcium Carbonate-Vitamin D (CALTRATE 600+D) 600-400 MG-UNIT per tablet Take 1 tablet by mouth 2 (two) times daily.    Yes [provider]  Cholecalciferol (VITAMIN D3) 2000 UNITS TABS Take 1 capsule by mouth daily.     Yes [provider]  glyBURIDE (DIABETA) 5 MG tablet Take 2 tablets (10 mg total) by mouth daily with breakfast. 03/22/18  Yes Nicholas Lose, MD  lisinopril (PRINIVIL,ZESTRIL) 10 MG tablet Take 1 tablet (10 mg total) by mouth daily. 08/30/10  Yes Noralee Space, MD  magnesium oxide (MAG-OX) 400 (241.3 Mg) MG tablet Take 1 tablet (400 mg total) by mouth daily. 03/22/18  Yes Nicholas Lose, MD  metFORMIN (GLUCOPHAGE) 500 MG tablet Take 2 tablets (1,000 mg total) by mouth 2 (two) times daily with a meal. 05/04/12  Yes Noralee Space, MD  Multiple Vitamins-Minerals (WOMENS DAILY FORMULA PO) Take 1 capsule by mouth daily.     Yes [provider]  sertraline (ZOLOFT) 50 MG tablet Take 50 mg by mouth daily.   Yes [provider]  simvastatin (ZOCOR) 20 MG  tablet Take 1 tablet (20 mg total) by mouth at bedtime. 05/03/12  Yes Noralee Space, MD  sitaGLIPtin (JANUVIA) 100 MG tablet Take 1 tablet (100 mg total) by mouth daily. 03/22/18  Yes Nicholas Lose, MD  zolpidem (AMBIEN) 10 MG tablet Take 0.5 tablets (5 mg total) by mouth at bedtime as needed. 08/30/10  Yes Noralee Space, MD     Positive ROS: None  All other systems have been reviewed and were otherwise negative with the exception of those mentioned in the HPI and as above.  Physical Exam: Vitals:   03/02/19 1340 03/02/19 1345  BP: 129/70 123/60  Pulse: 71 74  Resp: 19 18  Temp:    SpO2: 100% 100%    General: Alert, no acute distress Cardiovascular: No pedal edema Respiratory: No cyanosis, no use of accessory musculature GI: No organomegaly, abdomen is soft and non-tender Skin: No lesions in the area of chief complaint Neurologic: Sensation intact distally Psychiatric: Patient is competent for consent with normal mood and affect Lymphatic: No axillary or cervical lymphadenopathy  MUSCULOSKELETAL: Right shoulder: Weakness in external rotation.  Market positive primary and secondary impingement findings.  MRI: MRI shows rotator cuff tear with biceps tendon tear and retraction.  Assessment/Plan: RIGHT ROTATOR CUFF TEAR Plan for Procedure(s): SHOULDER ARTHROSCOPY WITH OPEN ROTATOR CUFF REPAIR  The risks benefits and alternatives were discussed with the patient including but not limited to the risks of nonoperative treatment, versus surgical intervention including infection, bleeding, nerve injury, malunion, nonunion, hardware prominence, hardware failure, need for hardware removal, blood clots, cardiopulmonary complications, morbidity, mortality, among others, and they were willing to proceed.  Predicted outcome is good, although there will be at least a six to nine month expected recovery.  Alta Corning, MD 03/02/2019 2:14 PM

## 2019-03-02 NOTE — Progress Notes (Signed)
Assisted Dr. Rose with right, ultrasound guided, interscalene  block. Side rails up, monitors on throughout procedure. See vital signs in flow sheet. Tolerated Procedure well. 

## 2019-03-02 NOTE — Anesthesia Procedure Notes (Signed)
Anesthesia Procedure Image    

## 2019-03-02 NOTE — Progress Notes (Signed)
Preparing to start regional nerve block 17mcg Fentanyl was given per Dr. Alene Mires order.  We were advised Dr. Berenice Primas would not be arriving for another hour so Dr. Kalman Shan decided to wait to do block closer to his arrival time. Vital signs stable and will continue to monitor.   Call light given to patient and she is resting comfortably.

## 2019-03-02 NOTE — Brief Op Note (Signed)
03/02/2019  4:06 PM  PATIENT:  Victoria Thornton  78 y.o. female  PRE-OPERATIVE DIAGNOSIS:  RIGHT ROTATOR CUFF TEAR  POST-OPERATIVE DIAGNOSIS:  RIGHT ROTATOR CUFF TEAR  PROCEDURE:  Procedure(s): SHOULDER ARTHROSCOPY WITH OPEN ROTATOR CUFF REPAIR AND DISTAL CLAVICLE ACROMINECTOMY  SURGEON:  Surgeon(s) and Role:    Dorna Leitz, MD - Primary  PHYSICIAN ASSISTANT:   ASSISTANTS: jim bethune   ANESTHESIA:   general  EBL:  50 mL   BLOOD ADMINISTERED:none  DRAINS: none   LOCAL MEDICATIONS USED:  NONE  SPECIMEN:  No Specimen  DISPOSITION OF SPECIMEN:  N/A  COUNTS:  YES  TOURNIQUET:  * No tourniquets in log *  DICTATION: .Other Dictation: Dictation Number [id number]  PLAN OF CARE: Discharge to home after PACU  PATIENT DISPOSITION:  PACU - hemodynamically stable.   Delay start of Pharmacological VTE agent (>24hrs) due to surgical blood loss or risk of bleeding: no

## 2019-03-02 NOTE — Anesthesia Procedure Notes (Signed)
Procedure Name: Intubation Date/Time: 03/02/2019 2:36 PM Performed by: Lavonia Dana, CRNA Pre-anesthesia Checklist: Patient identified, Emergency Drugs available, Suction available and Patient being monitored Patient Re-evaluated:Patient Re-evaluated prior to induction Oxygen Delivery Method: Circle system utilized Preoxygenation: Pre-oxygenation with 100% oxygen Induction Type: IV induction Ventilation: Mask ventilation without difficulty and Oral airway inserted - appropriate to patient size Laryngoscope Size: Sabra Heck and 2 Grade View: Grade I Tube type: Oral Tube size: 7.0 mm Number of attempts: 1 Airway Equipment and Method: Stylet and Oral airway Placement Confirmation: ETT inserted through vocal cords under direct vision,  positive ETCO2 and breath sounds checked- equal and bilateral Secured at: 22 cm Tube secured with: Tape Dental Injury: Teeth and Oropharynx as per pre-operative assessment

## 2019-03-02 NOTE — Anesthesia Procedure Notes (Signed)
Anesthesia Regional Block: Interscalene brachial plexus block   Pre-Anesthetic Checklist: ,, timeout performed, Correct Patient, Correct Site, Correct Laterality, Correct Procedure, Correct Position, site marked, Risks and benefits discussed,  Surgical consent,  Pre-op evaluation,  At surgeon's request and post-op pain management  Laterality: Right  Prep: chloraprep       Needles:  Injection technique: Single-shot  Needle Type: Echogenic Needle     Needle Length: 9cm      Additional Needles:   Procedures:,,,, ultrasound used (permanent image in chart),,,,  Narrative:  Start time: 03/02/2019 1:38 PM End time: 03/02/2019 1:45 PM Injection made incrementally with aspirations every 5 mL.  Performed by: Personally  Anesthesiologist: Myrtie Soman, MD  Additional Notes: Patient tolerated the procedure well without complications

## 2019-03-02 NOTE — Anesthesia Postprocedure Evaluation (Signed)
Anesthesia Post Note  Patient: Victoria Thornton  Procedure(s) Performed: SHOULDER ARTHROSCOPY WITH OPEN ROTATOR CUFF REPAIR AND DISTAL CLAVICLE ACROMINECTOMY (Shoulder)     Patient location during evaluation: PACU Anesthesia Type: General Level of consciousness: awake and alert Pain management: pain level controlled Vital Signs Assessment: post-procedure vital signs reviewed and stable Respiratory status: spontaneous breathing, nonlabored ventilation, respiratory function stable and patient connected to nasal cannula oxygen Cardiovascular status: blood pressure returned to baseline and stable Postop Assessment: no apparent nausea or vomiting Anesthetic complications: no    Last Vitals:  Vitals:   03/02/19 1617 03/02/19 1630  BP: (!) 142/64 (!) 97/59  Pulse: 81 81  Resp: 16 18  Temp: 36.5 C   SpO2: 100% 100%    Last Pain:  Vitals:   03/02/19 1637  TempSrc:   PainSc: 7                  Shawnee Higham S

## 2019-03-02 NOTE — Transfer of Care (Signed)
Immediate Anesthesia Transfer of Care Note  Patient: Victoria Thornton  Procedure(s) Performed: SHOULDER ARTHROSCOPY WITH OPEN ROTATOR CUFF REPAIR AND DISTAL CLAVICLE ACROMINECTOMY (Shoulder)  Patient Location: PACU  Anesthesia Type:GA combined with regional for post-op pain  Level of Consciousness: awake, alert  and oriented  Airway & Oxygen Therapy: Patient Spontanous Breathing and Patient connected to face mask oxygen  Post-op Assessment: Report given to RN and Post -op Vital signs reviewed and stable  Post vital signs: Reviewed and stable  Last Vitals:  Vitals Value Taken Time  BP 142/64 03/02/19 1617  Temp    Pulse 81 03/02/19 1620  Resp 11 03/02/19 1620  SpO2 100 % 03/02/19 1620  Vitals shown include unvalidated device data.  Last Pain:  Vitals:   03/02/19 1231  TempSrc: Oral  PainSc: 0-No pain         Complications: No apparent anesthesia complications

## 2019-03-03 ENCOUNTER — Encounter (HOSPITAL_BASED_OUTPATIENT_CLINIC_OR_DEPARTMENT_OTHER): Payer: Self-pay | Admitting: Orthopedic Surgery

## 2019-03-03 ENCOUNTER — Inpatient Hospital Stay (HOSPITAL_COMMUNITY)
Admission: EM | Admit: 2019-03-03 | Discharge: 2019-03-04 | DRG: 166 | Disposition: A | Discharge status: Home / Self Care | Payer: Medicare Other | Attending: Internal Medicine | Admitting: Internal Medicine

## 2019-03-03 DIAGNOSIS — X58XXXA Exposure to other specified factors, initial encounter: Secondary | ICD-10-CM | POA: Diagnosis present

## 2019-03-03 DIAGNOSIS — Z79899 Other long term (current) drug therapy: Secondary | ICD-10-CM

## 2019-03-03 DIAGNOSIS — E119 Type 2 diabetes mellitus without complications: Secondary | ICD-10-CM

## 2019-03-03 DIAGNOSIS — I341 Nonrheumatic mitral (valve) prolapse: Secondary | ICD-10-CM | POA: Diagnosis present

## 2019-03-03 DIAGNOSIS — N289 Disorder of kidney and ureter, unspecified: Secondary | ICD-10-CM

## 2019-03-03 DIAGNOSIS — K219 Gastro-esophageal reflux disease without esophagitis: Secondary | ICD-10-CM | POA: Diagnosis present

## 2019-03-03 DIAGNOSIS — Z86010 Personal history of colonic polyps: Secondary | ICD-10-CM

## 2019-03-03 DIAGNOSIS — D649 Anemia, unspecified: Secondary | ICD-10-CM | POA: Diagnosis present

## 2019-03-03 DIAGNOSIS — Z923 Personal history of irradiation: Secondary | ICD-10-CM

## 2019-03-03 DIAGNOSIS — Z9181 History of falling: Secondary | ICD-10-CM

## 2019-03-03 DIAGNOSIS — M19019 Primary osteoarthritis, unspecified shoulder: Secondary | ICD-10-CM | POA: Diagnosis present

## 2019-03-03 DIAGNOSIS — E559 Vitamin D deficiency, unspecified: Secondary | ICD-10-CM | POA: Diagnosis present

## 2019-03-03 DIAGNOSIS — Z20828 Contact with and (suspected) exposure to other viral communicable diseases: Secondary | ICD-10-CM | POA: Diagnosis present

## 2019-03-03 DIAGNOSIS — M19012 Primary osteoarthritis, left shoulder: Secondary | ICD-10-CM | POA: Diagnosis present

## 2019-03-03 DIAGNOSIS — Z853 Personal history of malignant neoplasm of breast: Secondary | ICD-10-CM

## 2019-03-03 DIAGNOSIS — Z7984 Long term (current) use of oral hypoglycemic drugs: Secondary | ICD-10-CM

## 2019-03-03 DIAGNOSIS — Z8673 Personal history of transient ischemic attack (TIA), and cerebral infarction without residual deficits: Secondary | ICD-10-CM

## 2019-03-03 DIAGNOSIS — J449 Chronic obstructive pulmonary disease, unspecified: Secondary | ICD-10-CM | POA: Diagnosis present

## 2019-03-03 DIAGNOSIS — Z96611 Presence of right artificial shoulder joint: Secondary | ICD-10-CM | POA: Diagnosis present

## 2019-03-03 DIAGNOSIS — M25811 Other specified joint disorders, right shoulder: Secondary | ICD-10-CM | POA: Diagnosis present

## 2019-03-03 DIAGNOSIS — Z79891 Long term (current) use of opiate analgesic: Secondary | ICD-10-CM

## 2019-03-03 DIAGNOSIS — I10 Essential (primary) hypertension: Secondary | ICD-10-CM | POA: Diagnosis present

## 2019-03-03 DIAGNOSIS — J9601 Acute respiratory failure with hypoxia: Secondary | ICD-10-CM | POA: Diagnosis present

## 2019-03-03 DIAGNOSIS — R6510 Systemic inflammatory response syndrome (SIRS) of non-infectious origin without acute organ dysfunction: Secondary | ICD-10-CM | POA: Diagnosis present

## 2019-03-03 DIAGNOSIS — M75101 Unspecified rotator cuff tear or rupture of right shoulder, not specified as traumatic: Secondary | ICD-10-CM | POA: Diagnosis present

## 2019-03-03 DIAGNOSIS — Z833 Family history of diabetes mellitus: Secondary | ICD-10-CM

## 2019-03-03 DIAGNOSIS — J690 Pneumonitis due to inhalation of food and vomit: Secondary | ICD-10-CM | POA: Diagnosis present

## 2019-03-03 DIAGNOSIS — J40 Bronchitis, not specified as acute or chronic: Secondary | ICD-10-CM | POA: Diagnosis present

## 2019-03-03 DIAGNOSIS — Z87891 Personal history of nicotine dependence: Secondary | ICD-10-CM

## 2019-03-03 DIAGNOSIS — R0602 Shortness of breath: Secondary | ICD-10-CM

## 2019-03-03 DIAGNOSIS — S2241XA Multiple fractures of ribs, right side, initial encounter for closed fracture: Secondary | ICD-10-CM | POA: Diagnosis present

## 2019-03-03 DIAGNOSIS — E7800 Pure hypercholesterolemia, unspecified: Secondary | ICD-10-CM | POA: Diagnosis present

## 2019-03-03 MED ORDER — ALBUTEROL SULFATE HFA 108 (90 BASE) MCG/ACT IN AERS
6.0000 | INHALATION_SPRAY | Freq: Once | RESPIRATORY_TRACT | Status: AC
  Administered 2019-03-04: 6 via RESPIRATORY_TRACT
  Filled 2019-03-03: qty 6.7

## 2019-03-03 MED ORDER — IPRATROPIUM BROMIDE HFA 17 MCG/ACT IN AERS
2.0000 | INHALATION_SPRAY | Freq: Once | RESPIRATORY_TRACT | Status: AC
  Administered 2019-03-04: 2 via RESPIRATORY_TRACT
  Filled 2019-03-03: qty 12.9

## 2019-03-03 NOTE — ED Triage Notes (Signed)
Pt from home c/o [facility] since about noon today. Per friend, it has worsened throughout the day. EMS reports hearing wheezing and decreased lung sounds. EMS gave 125 solumedrol and 2 g mag. EMS also began 5 Alb and 0.4 atrovent but, stopped PTA. Pt is AOx4. Pt had recent right rotator cuff surgery.

## 2019-03-03 NOTE — Op Note (Signed)
NAME: Victoria, Thornton MEDICAL RECORD [id number] ACCOUNT [id number] DATE OF BIRTH:04/15/1941 FACILITY: [facility] LOCATION: [facility] PHYSICIAN:Patrece Tallie L. Phillippa Straub, MD  OPERATIVE REPORT  DATE OF PROCEDURE:  03/02/2019  PREOPERATIVE DIAGNOSES:  Impingement, acromioclavicular joint arthritis, chronic rotator cuff tear, and chronic biceps tendon rupture.  POSTOPERATIVE DIAGNOSES:  Impingement, acromioclavicular joint arthritis, chronic rotator cuff tear, and chronic biceps tendon rupture.  PROCEDURE: 1.  Arthroscopic subacromial decompression. 2.  Arthroscopic distal clavicle resection from anterior compartment over 20 mm. 3.  Arthroscopic debridement, extensive, of superior labrum, and rotator cuff tear which were catching in the joint. 4.  Open rotator cuff repair of a chronically torn rotator cuff.  SURGEON:  Dorna Leitz, MD  ASSISTANT:  Gaspar Skeeters, PA-C  ANESTHESIA:  General.  BRIEF HISTORY:  The patient is a 78 year old female with a long history of significant complaints of right shoulder pain.  She has been treated conservatively for a prolonged period of time.  MRI showed a rotator cuff tear with displacement with some  fatty atrophy of the supraspinatus muscle.  We talked about treatment options but felt that open repair gave her her best chance of getting something that remained repaired.  She was brought to the operating room for this procedure.  She was noted also  to have a biceps tendon rupture with a stump catching in the joint.  DESCRIPTION OF PROCEDURE:  The patient was taken to the operating room after adequate anesthesia was obtained with a general anesthetic.  The patient was placed on the operating table.  She was then moved to the beach-chair position.  All bony  prominences were well padded.  Attention was turned to the right shoulder after routine prep and drape.  After routine prep and drape, an incision was made and arthroscopic examination of the shoulder was  undertaken.  She had obviously torn biceps tendon  with a flapping portion of the biceps tendon to the joint.  We debrided this back to the superior labrum using an Arthrocare wand to really contour down that superior labrum.  The rotator cuff tear was identified and was significant.  We felt that we  probably could get it over laterally, but we knew it was going to be a challenge.  The glenohumeral joint had no significant arthritic or degenerative change.  At this point, the camera was moved out of the glenohumeral joint into the subacromial space,  and arthroscopic distal clavicle resection was performed over 20 mm.  Arthroscopic subacromial decompression was performed over the anterior lateral portion of the acromion.  Once this was done, attention was turned to that rotator cuff tear.  It looked  like it was going to need to be manipulated and mobilized, and we felt that the best bet there was going to be to do a mini-open repair.  At this time, the arthroscopic portion was abandoned.  A small incision was made over the lateral aspect of the  shoulder.  Subcutaneous tissue down to the level of the deltoid and the deltoid retractors put in place.  A large bursectomy was performed, and then you could see the large rotator cuff tear.  At this point, we took the extent of the tear and put a 4.75  SwiveLock in the anterior portion at the articular margin.  We put 1 at the posterior portion of the articular margin, passed 2 FiberTapes as well as 2 labral tapes which we had added into this medial row as well.  We then took the limbs, alternating  posteriorly, and then the opposite alternating limbs anteriorly and pulled it over to a double-row repair.  We got a really nice double-row repair.  It was really held down nicely.  At this point, the wound was irrigated, suctioned dry, and closed in  layers with the deltoid being closed with 1 Vicryl running, the skin with 2-0 Vicryl, and 3-0 Monocryl subcuticular.   Benzoin, Steri-Strips, and a dry sterile compressive dressing were applied.  The patient was taken to recovery.  She was noted to be in  satisfactory condition.  Estimated blood loss for procedure was minimal.  LN/NUANCE  D:03/02/2019 T:03/03/2019 JOB:[id number]

## 2019-03-03 NOTE — ED Provider Notes (Signed)
[place] EMERGENCY DEPARTMENT Provider Note  CSN: [id number] Arrival date & time: 03/03/19 2329  Chief Complaint(s) Shortness of Breath  HPI Victoria Thornton is a 78 y.o. female with a past medical history listed below who recently had right rotator cuff surgery who presents to the emergency department with 1 day of gradually worsening shortness of breath found to be hypoxic by EMS with sats in the low 80s.  EMS noted decreased lung sounds and wheezing.  Patient given magnesium and Solu-Medrol.  Reports improvement but still requiring oxygen.  Patient denies any chest pain.  No recent fevers or infections.  No abdominal pain.  No nausea or vomiting.  Denies any known history of COPD.  Does report 25-pack-year smoking and quit 15 years ago.   No prior history of DVTs or PEs.  HPI  Past Medical History Past Medical History:  Diagnosis Date  . Breast cancer ([place])   . Chronic bronchitis   . Chronic hoarseness   . Colonic polyp   . Diabetes mellitus   . Diverticulosis of colon (without mention of hemorrhage)   . Dysthymia   . GERD (gastroesophageal reflux disease)   . MVP (mitral valve prolapse)   . Overweight(278.02)   . Personal history of radiation therapy   . Pure hypercholesterolemia   . Stroke [facility]) March 23,2012  . Unspecified vitamin D deficiency   . Vocal cord polyp    Patient Active Problem List   Diagnosis Date Noted  . Acute respiratory failure with hypoxia ([place]) 03/04/2019  . Ductal carcinoma in situ (DCIS) of right breast 03/22/2018  . Renal insufficiency 01/20/2012  . Microscopic hematuria 01/20/2012  . Occlusion and stenosis of carotid artery without mention of cerebral infarction 10/21/2011  . Borderline hypertension 09/30/2010  . Stroke ([place]) 09/30/2010  . Cerebrovascular disease 09/30/2010  . Health maintenance examination 08/27/2010  . VITAMIN D DEFICIENCY 08/04/2009  . DIABETES MELLITUS 06/18/2007  . HYPERCHOLESTEROLEMIA  06/18/2007  . OVERWEIGHT 06/18/2007  . DYSTHYMIA 06/18/2007  . MITRAL VALVE PROLAPSE 06/18/2007  . VOCAL CORD POLYP 06/18/2007  . BRONCHITIS, CHRONIC 06/18/2007  . DIVERTICULOSIS OF COLON 06/18/2007  . HOARSENESS, CHRONIC 06/18/2007  . COLONIC POLYPS 06/17/2007  . GERD 06/17/2007   Home Medication(s) Prior to Admission medications   Medication Sig Start Date End Date Taking? Authorizing Provider  anastrozole (ARIMIDEX) 1 MG tablet Take 1 tablet (1 mg total) by mouth daily. 03/22/18  Yes Nicholas Lose, MD  Calcium Carbonate-Vitamin D (CALTRATE 600+D) 600-400 MG-UNIT per tablet Take 1 tablet by mouth 2 (two) times daily.    Yes [provider]  Cholecalciferol (VITAMIN D3) 2000 UNITS TABS Take 2,000 Units by mouth daily.    Yes [provider]  glyBURIDE (DIABETA) 5 MG tablet Take 2 tablets (10 mg total) by mouth daily with breakfast. 03/22/18  Yes Nicholas Lose, MD  HYDROcodone-acetaminophen (NORCO) 5-325 MG tablet Take 1-2 tablets by mouth every 6 (six) hours as needed for moderate pain. 03/02/19  Yes Gary Fleet, PA-C  lisinopril (PRINIVIL,ZESTRIL) 10 MG tablet Take 1 tablet (10 mg total) by mouth daily. 08/30/10  Yes Noralee Space, MD  magnesium oxide (MAG-OX) 400 (241.3 Mg) MG tablet Take 1 tablet (400 mg total) by mouth daily. 03/22/18  Yes Nicholas Lose, MD  metFORMIN (GLUCOPHAGE) 500 MG tablet Take 2 tablets (1,000 mg total) by mouth 2 (two) times daily with a meal. 05/04/12  Yes Noralee Space, MD  Multiple Vitamins-Minerals (WOMENS DAILY FORMULA PO) Take 1 capsule  by mouth daily.     Yes [provider]  sertraline (ZOLOFT) 50 MG tablet Take 50 mg by mouth daily.   Yes [provider]  simvastatin (ZOCOR) 20 MG tablet Take 1 tablet (20 mg total) by mouth at bedtime. 05/03/12  Yes Noralee Space, MD  sitaGLIPtin (JANUVIA) 100 MG tablet Take 1 tablet (100 mg total) by mouth daily. 03/22/18  Yes Nicholas Lose, MD  tiZANidine (ZANAFLEX) 2 MG tablet  Take 1 tablet (2 mg total) by mouth every 8 (eight) hours as needed for muscle spasms. 03/02/19  Yes Gary Fleet, PA-C  zolpidem (AMBIEN) 10 MG tablet Take 0.5 tablets (5 mg total) by mouth at bedtime as needed. Patient taking differently: Take 5 mg by mouth at bedtime as needed for sleep.  08/30/10  Yes Noralee Space, MD                                                                                                                                    Past Surgical History Past Surgical History:  Procedure Laterality Date  . APPENDECTOMY    . BREAST BIOPSY    . BREAST CYST ASPIRATION  1995  . BREAST EXCISIONAL BIOPSY    . BREAST LUMPECTOMY Right    2016  . SHOULDER ARTHROSCOPY WITH OPEN ROTATOR CUFF REPAIR AND DISTAL CLAVICLE ACROMINECTOMY  03/02/2019   Procedure: SHOULDER ARTHROSCOPY WITH OPEN ROTATOR CUFF REPAIR AND DISTAL CLAVICLE ACROMINECTOMY;  Surgeon: Dorna Leitz, MD;  Location: [facility];  Service: Orthopedics;;  . TONSILLECTOMY  1960  . vocal cord polyps  1998   Family History Family History  Problem Relation Age of Onset  . Prostate cancer Father   . Heart attack Father   . Heart disease Father        Heart Disease before age 19  . Diabetes Mother   . Depression Mother   . Hypertension Brother   . Multiple sclerosis Daughter     Social History Social History   Tobacco Use  . Smoking status: Former Smoker    Packs/day: 1.00    Years: 40.00    Pack years: 40.00    Types: Cigarettes    Quit date: 05/19/1997    Years since quitting: 21.8  . Smokeless tobacco: Never Used  Substance Use Topics  . Alcohol use: Yes    Comment: social ETOH  . Drug use: No   Allergies Patient has no known allergies.  Review of Systems Review of Systems All other systems are reviewed and are negative for acute change except as noted in the HPI  Physical Exam Vital Signs  I have reviewed the triage vital signs BP (!) 127/54   Pulse 97   Temp 98.8 F (37.1 C)    Resp 19   SpO2 95%   Physical Exam Vitals signs reviewed.  Constitutional:      General: She is not in acute  distress.    Appearance: She is well-developed. She is not diaphoretic.  HENT:     Head: Normocephalic and atraumatic.     Nose: Nose normal.  Eyes:     General: No scleral icterus.       Right eye: No discharge.        Left eye: No discharge.     Conjunctiva/sclera: Conjunctivae normal.     Pupils: Pupils are equal, round, and reactive to light.  Neck:     Musculoskeletal: Normal range of motion and neck supple.  Cardiovascular:     Rate and Rhythm: Normal rate and regular rhythm.     Heart sounds: No murmur. No friction rub. No gallop.   Pulmonary:     Effort: Pulmonary effort is normal. Prolonged expiration present.     Breath sounds: No stridor. Wheezing (diffuse) present. No rales.  Abdominal:     General: There is no distension.     Palpations: Abdomen is soft.     Tenderness: There is no abdominal tenderness.  Musculoskeletal:        General: No tenderness.       Arms:  Skin:    General: Skin is warm and dry.     Findings: No erythema or rash.  Neurological:     Mental Status: She is alert and oriented to person, place, and time.     ED Results and Treatments Labs (all labs ordered are listed, but only abnormal results are displayed) Labs Reviewed  CBC WITH DIFFERENTIAL/PLATELET - Abnormal; Notable for the following components:      Result Value   WBC 13.5 (*)    RBC 3.65 (*)    Hemoglobin 10.6 (*)    HCT 33.6 (*)    Neutro Abs 8.6 (*)    Monocytes Absolute 1.2 (*)    Abs Immature Granulocytes 0.08 (*)    All other components within normal limits  BASIC METABOLIC PANEL - Abnormal; Notable for the following components:   Glucose, Bld 145 (*)    Creatinine, Ser 1.14 (*)    GFR calc non Af Amer 46 (*)    GFR calc Af Amer 53 (*)    All other components within normal limits  POCT I-STAT EG7 - Abnormal; Notable for the following components:    pO2, Ven 126.0 (*)    Sodium 134 (*)    HCT 31.0 (*)    Hemoglobin 10.5 (*)    All other components within normal limits  BLOOD GAS, VENOUS  BRAIN NATRIURETIC PEPTIDE                                                                                                                         EKG  EKG Interpretation  Date/Time:    Ventricular Rate:    PR Interval:    QRS Duration:   QT Interval:    QTC Calculation:   R Axis:     Text Interpretation:  Radiology Ct Angio Chest Pe W And/or Wo Contrast  Result Date: 03/04/2019 CLINICAL DATA:  PE suspected, shortness of breath and wheezing for 1 day. EXAM: CT ANGIOGRAPHY CHEST WITH CONTRAST TECHNIQUE: Multidetector CT imaging of the chest was performed using the standard protocol during bolus administration of intravenous contrast. Multiplanar CT image reconstructions and MIPs were obtained to evaluate the vascular anatomy. CONTRAST:  9mL OMNIPAQUE IOHEXOL 350 MG/ML SOLN COMPARISON:  Radiograph 03/04/2019 FINDINGS: Cardiovascular: Satisfactory opacification of the pulmonary arteries to the segmental level. No pulmonary arterial filling defects are identified. There is prominence of the right and left main pulmonary arteries which may reflect chronic pulmonary arterial hypertension. No elevation of the RV/LV ratio (0.8). Normal heart size. No pericardial effusion. Three-vessel coronary calcifications are noted. Calcification of the aortic leaflets as well as the aortic arch and proximal great vessels. Normal 3 vessel branching of the normal caliber aorta. Mediastinum/Nodes: No mediastinal, hilar or axillary adenopathy. Secretions are noted in the lower trachea and central bronchi. No acute abnormality of the esophagus. Small hiatal hernia. Lungs/Pleura: Extensive centrilobular and paraseptal emphysematous changes are present throughout the lungs there is diffuse airways thickening and scattered secretions throughout the smaller airways.  Peripheral regions of atelectasis are present in the lungs right greater than left with elevation of the right hemidiaphragm. No pneumothorax or pleural fluid. Upper Abdomen: No acute abnormalities present in the visualized portions of the upper abdomen. Musculoskeletal: Extensive soft tissue stranding is noted about the right shoulder and right chest wall. Some of which is likely related to patient's recent shoulder surgery particularly the gas noted along the anterior and superior aspect of the right shoulder. There is evidence of surgical anchor placement of the greater tuberosity right humerus. There are multiple right-sided rib fractures of different ages several appear more remote in the lower right thoracic levels while fractures at the right fourth fifth and sixth ribs have a more acute to subacute appearance. Review of the MIP images confirms the above findings. IMPRESSION: 1. No evidence of pulmonary embolism. 2. Prominence of the right and left main pulmonary arteries may reflect chronic pulmonary arterial hypertension, likely related to the patient's chronic emphysematous parenchymal changes. 3. Extensive soft tissue stranding about the right shoulder and right chest wall, likely related to patient's recent shoulder surgery. 4. Multiple right-sided rib fractures of different fractures of the right fourth, fifth and sixth ribs have a more acute to subacute appearance. Correlate for point tenderness. 5. Elevation of the right hemidiaphragm with asymmetrically increased volume loss and atelectasis in the right lung. Some of this may be related to splinting or postoperative atelectasis. Additionally, secretions are present throughout the airways and right mainstem bronchus. Some of the volume loss could also be related to mucous plugging or an underlying aspiration. 6. Aortic Atherosclerosis (ICD10-I70.0) 7. Emphysema (ICD10-J43.9). These results were called by telephone at the time of interpretation on  03/04/2019 at 3:34 am to provider [facility] , who verbally acknowledged these results. Electronically Signed   By: Lovena Le M.D.   On: 03/04/2019 03:35   Dg Chest Port 1 View  Result Date: 03/04/2019 CLINICAL DATA:  Shortness of breath, shoulder surgery performed on Wednesday. EXAM: PORTABLE CHEST 1 VIEW COMPARISON:  Radiograph January 14, 2012 FINDINGS: There is new nonspecific elevation of the right hemidiaphragm with adjacent hazy opacity likely reflecting atelectasis. Additional fine interstitial changes are similar to prior exams. The aorta is calcified. The remaining cardiomediastinal contours are unremarkable. Degenerative changes are present in the imaged  spine and shoulders. Surgical clips noted in the right breast/chest wall. IMPRESSION: New nonspecific elevation of the right hemidiaphragm with adjacent hazy opacity, likely reflecting atelectasis. Electronically Signed   By: Lovena Le M.D.   On: 03/04/2019 00:23    Pertinent labs & imaging results that were available during my care of the patient were reviewed by me and considered in my medical decision making (see chart for details).  Medications Ordered in ED Medications  piperacillin-tazobactam (ZOSYN) IVPB 3.375 g (3.375 g Intravenous New Bag/Given 03/04/19 0525)  piperacillin-tazobactam (ZOSYN) IVPB 3.375 g (has no administration in time range)  ipratropium (ATROVENT) nebulizer solution 0.5 mg (has no administration in time range)  levalbuterol (XOPENEX) nebulizer solution 0.63 mg (has no administration in time range)  dextromethorphan-guaiFENesin (MUCINEX DM) 30-600 MG per 12 hr tablet 1 tablet (has no administration in time range)  albuterol (VENTOLIN HFA) 108 (90 Base) MCG/ACT inhaler 6 puff (6 puffs Inhalation Given 03/04/19 0029)  ipratropium (ATROVENT HFA) inhaler 2 puff (2 puffs Inhalation Given 03/04/19 0201)  iohexol (OMNIPAQUE) 350 MG/ML injection 80 mL (80 mLs Intravenous Contrast Given 03/04/19 0311)                                                                                                                                     Procedures Procedures  (including critical care time)  Medical Decision Making / ED Course I have reviewed the nursing notes for this encounter and the patient's prior records (if available in EHR or on provided paperwork).   CRESHA LEIDER was evaluated in Emergency Department on 03/04/2019 for the symptoms described in the history of present illness. She was evaluated in the context of the global COVID-19 pandemic, which necessitated consideration that the patient might be at risk for infection with the SARS-CoV-2 virus that causes COVID-19. Institutional protocols and algorithms that pertain to the evaluation of patients at risk for COVID-19 are in a state of rapid change based on information released by regulatory bodies including the CDC and federal and state organizations. These policies and algorithms were followed during the patient's care in the ED.  Patient presented with shortness of breath.  Wheezing and decreased air movement throughout.  Given breathing treatments.  Chest x-ray with right lower lobe atelectasis.   Noted to have leukocytosis.  Mild anemia likely from surgery.  After treatment patient still requiring 2 L nasal cannula.  Work-up expanded including a CT a negative for PE but did reveal acute and subacute right rib fractures with evidence of possible aspiration.  Patient started on Zosyn for possible aspiration pneumonia.  Upon questioning patient reported that she had fallen last week and again last night.  Denies any pain related to it.  Admitted to medicine for further work-up and management.     Final Clinical Impression(s) / ED Diagnoses Final diagnoses:  SOB (shortness of breath)  Aspiration pneumonia of right lower lobe, unspecified aspiration  pneumonia type [facility])      This chart was dictated using voice recognition software.   Despite best efforts to proofread,  errors can occur which can change the documentation meaning.   Fatima Blank, MD 03/04/19 [phone]

## 2019-03-04 ENCOUNTER — Emergency Department (HOSPITAL_COMMUNITY): Payer: Medicare Other

## 2019-03-04 ENCOUNTER — Encounter (HOSPITAL_COMMUNITY): Payer: Self-pay | Admitting: Radiology

## 2019-03-04 DIAGNOSIS — Z86010 Personal history of colonic polyps: Secondary | ICD-10-CM | POA: Diagnosis not present

## 2019-03-04 DIAGNOSIS — S2241XA Multiple fractures of ribs, right side, initial encounter for closed fracture: Secondary | ICD-10-CM | POA: Diagnosis present

## 2019-03-04 DIAGNOSIS — Z923 Personal history of irradiation: Secondary | ICD-10-CM | POA: Diagnosis not present

## 2019-03-04 DIAGNOSIS — Z96611 Presence of right artificial shoulder joint: Secondary | ICD-10-CM

## 2019-03-04 DIAGNOSIS — I10 Essential (primary) hypertension: Secondary | ICD-10-CM | POA: Diagnosis present

## 2019-03-04 DIAGNOSIS — J9601 Acute respiratory failure with hypoxia: Secondary | ICD-10-CM | POA: Diagnosis present

## 2019-03-04 DIAGNOSIS — Z833 Family history of diabetes mellitus: Secondary | ICD-10-CM | POA: Diagnosis not present

## 2019-03-04 DIAGNOSIS — R6510 Systemic inflammatory response syndrome (SIRS) of non-infectious origin without acute organ dysfunction: Secondary | ICD-10-CM | POA: Diagnosis present

## 2019-03-04 DIAGNOSIS — M19012 Primary osteoarthritis, left shoulder: Secondary | ICD-10-CM | POA: Diagnosis present

## 2019-03-04 DIAGNOSIS — E7800 Pure hypercholesterolemia, unspecified: Secondary | ICD-10-CM | POA: Diagnosis present

## 2019-03-04 DIAGNOSIS — M75101 Unspecified rotator cuff tear or rupture of right shoulder, not specified as traumatic: Secondary | ICD-10-CM | POA: Diagnosis present

## 2019-03-04 DIAGNOSIS — X58XXXA Exposure to other specified factors, initial encounter: Secondary | ICD-10-CM | POA: Diagnosis present

## 2019-03-04 DIAGNOSIS — J690 Pneumonitis due to inhalation of food and vomit: Secondary | ICD-10-CM | POA: Diagnosis present

## 2019-03-04 DIAGNOSIS — Z853 Personal history of malignant neoplasm of breast: Secondary | ICD-10-CM | POA: Diagnosis not present

## 2019-03-04 DIAGNOSIS — J40 Bronchitis, not specified as acute or chronic: Secondary | ICD-10-CM | POA: Diagnosis present

## 2019-03-04 DIAGNOSIS — E559 Vitamin D deficiency, unspecified: Secondary | ICD-10-CM | POA: Diagnosis present

## 2019-03-04 DIAGNOSIS — K219 Gastro-esophageal reflux disease without esophagitis: Secondary | ICD-10-CM | POA: Diagnosis present

## 2019-03-04 DIAGNOSIS — Z20828 Contact with and (suspected) exposure to other viral communicable diseases: Secondary | ICD-10-CM | POA: Diagnosis present

## 2019-03-04 DIAGNOSIS — R0602 Shortness of breath: Secondary | ICD-10-CM | POA: Diagnosis present

## 2019-03-04 DIAGNOSIS — N289 Disorder of kidney and ureter, unspecified: Secondary | ICD-10-CM | POA: Diagnosis present

## 2019-03-04 DIAGNOSIS — I341 Nonrheumatic mitral (valve) prolapse: Secondary | ICD-10-CM | POA: Diagnosis present

## 2019-03-04 DIAGNOSIS — D649 Anemia, unspecified: Secondary | ICD-10-CM | POA: Diagnosis present

## 2019-03-04 DIAGNOSIS — M25811 Other specified joint disorders, right shoulder: Secondary | ICD-10-CM | POA: Diagnosis present

## 2019-03-04 DIAGNOSIS — J441 Chronic obstructive pulmonary disease with (acute) exacerbation: Secondary | ICD-10-CM | POA: Diagnosis not present

## 2019-03-04 DIAGNOSIS — E119 Type 2 diabetes mellitus without complications: Secondary | ICD-10-CM | POA: Diagnosis present

## 2019-03-04 DIAGNOSIS — J449 Chronic obstructive pulmonary disease, unspecified: Secondary | ICD-10-CM | POA: Diagnosis present

## 2019-03-04 DIAGNOSIS — Z79891 Long term (current) use of opiate analgesic: Secondary | ICD-10-CM | POA: Diagnosis not present

## 2019-03-04 DIAGNOSIS — M19019 Primary osteoarthritis, unspecified shoulder: Secondary | ICD-10-CM | POA: Diagnosis present

## 2019-03-04 DIAGNOSIS — Z79899 Other long term (current) drug therapy: Secondary | ICD-10-CM | POA: Diagnosis not present

## 2019-03-04 DIAGNOSIS — E1111 Type 2 diabetes mellitus with ketoacidosis with coma: Secondary | ICD-10-CM

## 2019-03-04 DIAGNOSIS — Z8673 Personal history of transient ischemic attack (TIA), and cerebral infarction without residual deficits: Secondary | ICD-10-CM | POA: Diagnosis not present

## 2019-03-04 LAB — CBC WITH DIFFERENTIAL/PLATELET
Abs Immature Granulocytes: 0.08 10*3/uL — ABNORMAL HIGH (ref 0.00–0.07)
Basophils Absolute: 0 10*3/uL (ref 0.0–0.1)
Basophils Relative: 0 %
Eosinophils Absolute: 0.2 10*3/uL (ref 0.0–0.5)
Eosinophils Relative: 1 %
HCT: 33.6 % — ABNORMAL LOW (ref 36.0–46.0)
Hemoglobin: 10.6 g/dL — ABNORMAL LOW (ref 12.0–15.0)
Immature Granulocytes: 1 %
Lymphocytes Relative: 26 %
Lymphs Abs: 3.5 10*3/uL (ref 0.7–4.0)
MCH: 29 pg (ref 26.0–34.0)
MCHC: 31.5 g/dL (ref 30.0–36.0)
MCV: 92.1 fL (ref 80.0–100.0)
Monocytes Absolute: 1.2 10*3/uL — ABNORMAL HIGH (ref 0.1–1.0)
Monocytes Relative: 9 %
Neutro Abs: 8.6 10*3/uL — ABNORMAL HIGH (ref 1.7–7.7)
Neutrophils Relative %: 63 %
Platelets: 226 10*3/uL (ref 150–400)
RBC: 3.65 MIL/uL — ABNORMAL LOW (ref 3.87–5.11)
RDW: 14.2 % (ref 11.5–15.5)
WBC: 13.5 10*3/uL — ABNORMAL HIGH (ref 4.0–10.5)
nRBC: 0 % (ref 0.0–0.2)

## 2019-03-04 LAB — SARS CORONAVIRUS 2 (TAT 6-24 HRS): SARS Coronavirus 2: NEGATIVE

## 2019-03-04 LAB — POCT I-STAT EG7
Acid-Base Excess: 2 mmol/L (ref 0.0–2.0)
Bicarbonate: 28 mmol/L (ref 20.0–28.0)
Calcium, Ion: 1.21 mmol/L (ref 1.15–1.40)
HCT: 31 % — ABNORMAL LOW (ref 36.0–46.0)
Hemoglobin: 10.5 g/dL — ABNORMAL LOW (ref 12.0–15.0)
O2 Saturation: 99 %
Potassium: 4.1 mmol/L (ref 3.5–5.1)
Sodium: 134 mmol/L — ABNORMAL LOW (ref 135–145)
TCO2: 29 mmol/L (ref 22–32)
pCO2, Ven: 50.6 mmHg (ref 44.0–60.0)
pH, Ven: 7.35 (ref 7.250–7.430)
pO2, Ven: 126 mmHg — ABNORMAL HIGH (ref 32.0–45.0)

## 2019-03-04 LAB — BRAIN NATRIURETIC PEPTIDE: B Natriuretic Peptide: 146.6 pg/mL — ABNORMAL HIGH (ref 0.0–100.0)

## 2019-03-04 LAB — BASIC METABOLIC PANEL
Anion gap: 11 (ref 5–15)
BUN: 19 mg/dL (ref 8–23)
CO2: 25 mmol/L (ref 22–32)
Calcium: 9.2 mg/dL (ref 8.9–10.3)
Chloride: 99 mmol/L (ref 98–111)
Creatinine, Ser: 1.14 mg/dL — ABNORMAL HIGH (ref 0.44–1.00)
GFR calc Af Amer: 53 mL/min — ABNORMAL LOW (ref >60)
GFR calc non Af Amer: 46 mL/min — ABNORMAL LOW (ref >60)
Glucose, Bld: 145 mg/dL — ABNORMAL HIGH (ref 70–99)
Potassium: 4.1 mmol/L (ref 3.5–5.1)
Sodium: 135 mmol/L (ref 135–145)

## 2019-03-04 MED ORDER — SIMVASTATIN 20 MG PO TABS: 20.0000 mg | ORAL_TABLET | Freq: Every day | ORAL | Status: DC

## 2019-03-04 MED ORDER — INSULIN ASPART 100 UNIT/ML ~~LOC~~ SOLN: 0.0000 [IU] | Freq: Three times a day (TID) | SUBCUTANEOUS | Status: DC

## 2019-03-04 MED ORDER — ONDANSETRON HCL 4 MG PO TABS: 4.0000 mg | ORAL_TABLET | Freq: Four times a day (QID) | ORAL | Status: DC | PRN

## 2019-03-04 MED ORDER — ACETAMINOPHEN 325 MG PO TABS: 650.0000 mg | ORAL_TABLET | Freq: Four times a day (QID) | ORAL | Status: DC | PRN

## 2019-03-04 MED ORDER — SERTRALINE HCL 50 MG PO TABS
50.0000 mg | ORAL_TABLET | Freq: Every day | ORAL | Status: DC
  Administered 2019-03-04: 50 mg via ORAL
  Filled 2019-03-04 (×2): qty 1

## 2019-03-04 MED ORDER — IPRATROPIUM BROMIDE 0.02 % IN SOLN
0.5000 mg | RESPIRATORY_TRACT | Status: DC
  Administered 2019-03-04: 0.5 mg via RESPIRATORY_TRACT
  Filled 2019-03-04: qty 2.5

## 2019-03-04 MED ORDER — ANASTROZOLE 1 MG PO TABS
1.0000 mg | ORAL_TABLET | Freq: Every day | ORAL | Status: DC
  Administered 2019-03-04: 1 mg via ORAL
  Filled 2019-03-04: qty 1

## 2019-03-04 MED ORDER — PIPERACILLIN-TAZOBACTAM 3.375 G IVPB
3.3750 g | Freq: Three times a day (TID) | INTRAVENOUS | Status: DC
  Administered 2019-03-04: 3.375 g via INTRAVENOUS
  Filled 2019-03-04: qty 50

## 2019-03-04 MED ORDER — LISINOPRIL 10 MG PO TABS
10.0000 mg | ORAL_TABLET | Freq: Every day | ORAL | Status: DC
  Administered 2019-03-04: 10 mg via ORAL
  Filled 2019-03-04: qty 1

## 2019-03-04 MED ORDER — ALBUTEROL SULFATE HFA 108 (90 BASE) MCG/ACT IN AERS: 1.0000 | INHALATION_SPRAY | RESPIRATORY_TRACT | Status: DC | PRN

## 2019-03-04 MED ORDER — HYDROCODONE-ACETAMINOPHEN 5-325 MG PO TABS: 1.0000 | ORAL_TABLET | Freq: Four times a day (QID) | ORAL | Status: DC | PRN

## 2019-03-04 MED ORDER — LEVALBUTEROL HCL 0.63 MG/3ML IN NEBU: 0.6300 mg | INHALATION_SOLUTION | Freq: Four times a day (QID) | RESPIRATORY_TRACT | Status: DC | PRN

## 2019-03-04 MED ORDER — CEPHALEXIN 250 MG PO CAPS: 500.0000 mg | ORAL_CAPSULE | Freq: Two times a day (BID) | ORAL | Status: DC

## 2019-03-04 MED ORDER — TIZANIDINE HCL 4 MG PO TABS: 2.0000 mg | ORAL_TABLET | Freq: Three times a day (TID) | ORAL | Status: DC | PRN

## 2019-03-04 MED ORDER — IPRATROPIUM BROMIDE HFA 17 MCG/ACT IN AERS
2.0000 | INHALATION_SPRAY | RESPIRATORY_TRACT | Status: DC | PRN
  Filled 2019-03-04 (×3): qty 12.9

## 2019-03-04 MED ORDER — ONDANSETRON HCL 4 MG/2ML IJ SOLN: 4.0000 mg | Freq: Four times a day (QID) | INTRAMUSCULAR | Status: DC | PRN

## 2019-03-04 MED ORDER — IOHEXOL 350 MG/ML SOLN
80.0000 mL | Freq: Once | INTRAVENOUS | Status: AC | PRN
  Administered 2019-03-04: 80 mL via INTRAVENOUS

## 2019-03-04 MED ORDER — IPRATROPIUM BROMIDE HFA 17 MCG/ACT IN AERS: 2.0000 | INHALATION_SPRAY | RESPIRATORY_TRACT | Status: DC | PRN

## 2019-03-04 MED ORDER — ENOXAPARIN SODIUM 40 MG/0.4ML ~~LOC~~ SOLN
40.0000 mg | SUBCUTANEOUS | Status: DC
  Filled 2019-03-04: qty 0.4

## 2019-03-04 MED ORDER — PREDNISONE 20 MG PO TABS: 40.0000 mg | ORAL_TABLET | Freq: Every day | ORAL | Status: DC

## 2019-03-04 MED ORDER — IPRATROPIUM BROMIDE 0.02 % IN SOLN: 0.5000 mg | RESPIRATORY_TRACT | Status: DC | PRN

## 2019-03-04 MED ORDER — ACETAMINOPHEN 650 MG RE SUPP: 650.0000 mg | Freq: Four times a day (QID) | RECTAL | Status: DC | PRN

## 2019-03-04 MED ORDER — PIPERACILLIN-TAZOBACTAM 3.375 G IVPB 30 MIN
3.3750 g | Freq: Once | INTRAVENOUS | Status: AC
  Administered 2019-03-04: 3.375 g via INTRAVENOUS
  Filled 2019-03-04: qty 50

## 2019-03-04 MED ORDER — INSULIN ASPART 100 UNIT/ML ~~LOC~~ SOLN: 0.0000 [IU] | Freq: Every day | SUBCUTANEOUS | Status: DC

## 2019-03-04 MED ORDER — PREDNISONE 20 MG PO TABS: ORAL_TABLET | ORAL | 0 refills | Status: DC

## 2019-03-04 MED ORDER — ZOLPIDEM TARTRATE 5 MG PO TABS: 5.0000 mg | ORAL_TABLET | Freq: Every evening | ORAL | Status: DC | PRN

## 2019-03-04 MED ORDER — ZOLPIDEM TARTRATE 10 MG PO TABS: 5.0000 mg | ORAL_TABLET | Freq: Every evening | ORAL | Status: AC | PRN

## 2019-03-04 MED ORDER — CEPHALEXIN 500 MG PO CAPS: 500.0000 mg | ORAL_CAPSULE | Freq: Two times a day (BID) | ORAL | 0 refills | Status: DC

## 2019-03-04 MED ORDER — DM-GUAIFENESIN ER 30-600 MG PO TB12: 1.0000 | ORAL_TABLET | Freq: Two times a day (BID) | ORAL | Status: DC | PRN

## 2019-03-04 MED ORDER — LEVALBUTEROL HCL 0.63 MG/3ML IN NEBU: 0.6300 mg | INHALATION_SOLUTION | RESPIRATORY_TRACT | Status: DC | PRN

## 2019-03-04 NOTE — Discharge Summary (Signed)
Victoria Thornton, is a 78 y.o. female  DOB [birth year]  MRN [id number].  Admission date:  03/03/2019  Admitting Physician  Ivor Costa, MD  Discharge Date:  03/04/2019   Primary MD  Reynold Bowen, MD  Recommendations for primary care physician for things to follow:    Discharge Diagnosis     Principal Problem:   Acute respiratory failure with hypoxia [facility]) Active Problems:   Type 2 diabetes mellitus without complication (HCC)   Renal insufficiency   Bronchitis   SIRS (systemic inflammatory response syndrome) (HCC)   Normocytic anemia   History of arthroplasty of right shoulder   Multiple fractures of ribs, right side, initial encounter for closed fracture   Aspiration pneumonia [facility])      Past Medical History:  Diagnosis Date  . Breast cancer ([place])   . Chronic bronchitis   . Chronic hoarseness   . Colonic polyp   . Diabetes mellitus   . Diverticulosis of colon (without mention of hemorrhage)   . Dysthymia   . GERD (gastroesophageal reflux disease)   . MVP (mitral valve prolapse)   . Overweight(278.02)   . Personal history of radiation therapy   . Pure hypercholesterolemia   . Stroke [facility]) March 23,2012  . Unspecified vitamin D deficiency   . Vocal cord polyp     Past Surgical History:  Procedure Laterality Date  . APPENDECTOMY    . BREAST BIOPSY    . BREAST CYST ASPIRATION  1995  . BREAST EXCISIONAL BIOPSY    . BREAST LUMPECTOMY Right    2016  . SHOULDER ARTHROSCOPY WITH OPEN ROTATOR CUFF REPAIR AND DISTAL CLAVICLE ACROMINECTOMY  03/02/2019   Procedure: SHOULDER ARTHROSCOPY WITH OPEN ROTATOR CUFF REPAIR AND DISTAL CLAVICLE ACROMINECTOMY;  Surgeon: Dorna Leitz, MD;  Location: [place];  Service: Orthopedics;;  . TONSILLECTOMY  1960  . vocal cord polyps  1998       HPI  from the  history and physical done on the day of admission:    Victoria Thornton is a 78 y.o. female with medical history significant of HLD, chronic bronchitis, diabetes mellitus type 2, CVA,  MVP, breast cancer s/p lumpectomy, and remote history of tobacco abuse.  She presented with complaints of shortness of breath upon going to bed last night.  Normally patient does not require oxygen.  Associated symptoms include wheezing.  Patient reports that she did not have time to try an inhaler before her cousin called EMS.  Denies having any significant fever, cough, chest pain, abdominal pain, nausea, vomiting, or diarrhea symptoms.  Patient had just undergone right shoulder arthroscopy with open rotator cuff repair with Dr. Berenice Primas on 10/14.  Her cousin had been staying with her following the surgery as the patient had been living alone.  She does also admit to having fallen 2-3 months ago and then again maybe 1 month ago onto her right side.  Her primary care provider had given her lidocaine patches which help with pain.  In route with EMS  reported to have O2 saturations in the 80s with wheezing. Given 125 mg of Solu-Medrol, 2 g of magnesium sulfate, and part of a DuoNeb breathing treatment.   Hospital Course:   Respiratory failure with hypoxia, suspected bronchitis, possible aspiration pneumonia: Acute.  Patient presents with worsening shortness of breath and wheezing.  Initially found to have O2 saturations in 80s on room air.  CTA of the chest revealing no signs of PE, but did show mucous plugging versus aspiration.  Patient had already received 125 mg Solu-Medrol with EMS in addition to breathing treatments.  She was also started on empiric antibiotics of Zosyn given elevated white count with concern for aspiration.  Patient has remote at least 25 smoking pack year.  Related to previously have bronchitis, but no formal diagnosis of COPD.  Patient weaned off of nasal cannula oxygen to room air.  She was requesting  to go home stating that she feels fine at this time. Patient was given 40 mg of prednisone to taper her next 4 days.  COVID-19 screening was negative.   SIRS: Acute.  Patient initially was found to be tachycardic and tachypneic with WBC elevated up to 13.5.  No initial lactic acid was obtained.  Suspect leukocytosis this is likely to patient's recent operation.    Normocytic anemia: Acute hemoglobin dropped down to 10.6 g/dL after recent shoulder surgery.  Repeat check stable at 10.5. No reports of active bleeding. Follow-up in outpatient setting and have repeat CBC performed.   Renal insufficiency: Acute.  On admission creatinine mildly elevated to 1.14, but baseline previously around 0.9.  History of right shoulder surgery: Status post right shoulder arthroscopy with open rotator cuff repair with Dr. Berenice Primas on 10/14. Continued hydrocodone as needed for pain.  Recommend the patient keep outpatient follow-up.  Right rib fracture: CTA showed old and new right rib fractures.  Patient provided with incentive spirometry.  Reports previous history of falling on the right side.  Utilizing lidocaine patches at home given by PCP.  Diabetes mellitus type 2: On admission blood glucose 145.  Home medications include glyburide, metformin, and sitagliptin.  No recent hemoglobin A1c on file.  Hemoglobin A1c was initially ordered but canceled after patient requesting to leave.  Essential hypertension: Blood pressure is currently stable. Continued lisinopril.  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    Follow UP  Follow-up Information    Reynold Bowen, MD. Schedule an appointment as soon as possible for a visit.   Specialty: Endocrinology Why: Call and make an appointment within 1 week from discharge. Contact information: [address] [place] [address] [phone]            Consults obtained: None  Discharge Condition: Stable  Diet  and Activity recommendation: See Discharge Instructions below   Discharge Instructions    Call MD for:  difficulty breathing, headache or visual disturbances   Complete by: As directed    Discharge instructions   Complete by: As directed    Call and set follow-up appointment with Reynold Bowen, MD in 7 days.  Your symptoms of shortness of breath are possibly related to acute bronchitis.  Imaging study showed signs of possible aspiration versus mucous plugging.  For your symptoms and findings we have put you on a short course of prednisone and antibiotics.  Please utilize inhalers to be given to you as needed for any shortness of breath/wheezing symptoms.  Return to the hospital for worsening shortness of breath or wheezing.  Get CBC, BMP checked  by Primary MD or SNF MD in 5-7 days ( we routinely change or add medications that can affect your baseline labs and fluid status, therefore we recommend that you get the mentioned basic workup next visit with your PCP, your PCP may decide not to get them or add new tests based on their clinical decision)  Activity: As tolerated with Full fall precautions use assistance as needed  Disposition: Home   Diet: Heart Healthy/carb modified          For Heart failure patients - Check your Weight same time everyday, if you gain over 2 pounds, or you develop in leg swelling, experience more shortness of breath or chest pain, call your Primary doctor immediately. Follow Cardiac Low Salt Diet and 1.5 lit/day fluid restriction.  Special Instructions: If you have smoked or chewed Tobacco  in the last 2 yrs please stop smoking, stop any regular Alcohol  and or any Recreational drug use.  On your next visit with your primary care physician please Get Medicines reviewed and adjusted.  Please request your Reynold Bowen, MD to go over all Hospital Tests and Procedure/Radiological results at the follow up, please get all Hospital records sent to your Prim MD by  signing hospital release before you go home.  If you experience worsening of your admission symptoms, develop shortness of breath, life threatening emergency, suicidal or homicidal thoughts you must seek medical attention immediately by calling 911 or calling your MD immediately  if symptoms less severe.  You Must read complete instructions/literature along with all the possible adverse reactions/side effects for all the Medicines you take and that have been prescribed to you. Take any new Medicines after you have completely understood and accpet all the possible adverse reactions/side effects.   Do not drive, operate heavy machinery, perform activities at heights, swimming or participation in water activities or provide baby sitting services if your were admitted for syncope or siezures until you have seen by Primary MD or a Neurologist and advised to do so again.  Do not drive when taking Pain medications.  Do not take more than prescribed Pain, Sleep and Anxiety Medications  Wear Seat belts while driving.   Please note  You were cared for by a hospitalist during your hospital stay. If you have any questions about your discharge medications or the care you received while you were in the hospital after you are discharged, you can call the unit and asked to speak with the hospitalist on call if the hospitalist that took care of you is not available. Once you are discharged, your primary care physician will handle any further medical issues. Please note that NO REFILLS for any discharge medications will be authorized once you are discharged, as it is imperative that you return to your primary care physician (or establish a relationship with a primary care physician if you do not have one) for your aftercare needs so that they can reassess your need for medications and monitor your lab values.   Increase activity slowly   Complete by: As directed         Discharge Medications     Allergies as  of 03/04/2019   No Known Allergies     Medication List    TAKE these medications   albuterol 108 (90 Base) MCG/ACT inhaler Commonly known as: VENTOLIN HFA Inhale 1-2 puffs into the lungs every 4 (four) hours as needed for wheezing or shortness of breath.   anastrozole  1 MG tablet Commonly known as: ARIMIDEX Take 1 tablet (1 mg total) by mouth daily.   Caltrate 600+D 600-400 MG-UNIT tablet Generic drug: Calcium Carbonate-Vitamin D Take 1 tablet by mouth 2 (two) times daily.   cephALEXin 500 MG capsule Commonly known as: KEFLEX Take 1 capsule (500 mg total) by mouth every 12 (twelve) hours.   glyBURIDE 5 MG tablet Commonly known as: DIABETA Take 2 tablets (10 mg total) by mouth daily with breakfast.   HYDROcodone-acetaminophen 5-325 MG tablet Commonly known as: Norco Take 1-2 tablets by mouth every 6 (six) hours as needed for moderate pain.   ipratropium 17 MCG/ACT inhaler Commonly known as: ATROVENT HFA Inhale 2 puffs into the lungs every 4 (four) hours as needed (Shortness of breath/wheezing).   lisinopril 10 MG tablet Commonly known as: ZESTRIL Take 1 tablet (10 mg total) by mouth daily.   magnesium oxide 400 (241.3 Mg) MG tablet Commonly known as: MAG-OX Take 1 tablet (400 mg total) by mouth daily.   metFORMIN 500 MG tablet Commonly known as: GLUCOPHAGE Take 2 tablets (1,000 mg total) by mouth 2 (two) times daily with a meal.   predniSONE 20 MG tablet Commonly known as: DELTASONE Take 2 tablets(40 mg) p.o. daily x2 days, then take 1 tablet (20 mg) p.o. daily x 2 days, and then stop Start taking on: March 05, 2019   sertraline 50 MG tablet Commonly known as: ZOLOFT Take 50 mg by mouth daily.   simvastatin 20 MG tablet Commonly known as: ZOCOR Take 1 tablet (20 mg total) by mouth at bedtime.   sitaGLIPtin 100 MG tablet Commonly known as: Januvia Take 1 tablet (100 mg total) by mouth daily.   tiZANidine 2 MG tablet Commonly known as: ZANAFLEX Take 1  tablet (2 mg total) by mouth every 8 (eight) hours as needed for muscle spasms.   Vitamin D3 50 MCG (2000 UT) Tabs Take 2,000 Units by mouth daily.   WOMENS DAILY FORMULA PO Take 1 capsule by mouth daily.   zolpidem 10 MG tablet Commonly known as: Ambien Take 0.5 tablets (5 mg total) by mouth at bedtime as needed for sleep.       Major procedures and Radiology Reports - PLEASE review detailed and final reports for all details, in brief -    Ct Angio Chest Pe W And/or Wo Contrast  Result Date: 03/04/2019 CLINICAL DATA:  PE suspected, shortness of breath and wheezing for 1 day. EXAM: CT ANGIOGRAPHY CHEST WITH CONTRAST TECHNIQUE: Multidetector CT imaging of the chest was performed using the standard protocol during bolus administration of intravenous contrast. Multiplanar CT image reconstructions and MIPs were obtained to evaluate the vascular anatomy. CONTRAST:  21mL OMNIPAQUE IOHEXOL 350 MG/ML SOLN COMPARISON:  Radiograph 03/04/2019 FINDINGS: Cardiovascular: Satisfactory opacification of the pulmonary arteries to the segmental level. No pulmonary arterial filling defects are identified. There is prominence of the right and left main pulmonary arteries which may reflect chronic pulmonary arterial hypertension. No elevation of the RV/LV ratio (0.8). Normal heart size. No pericardial effusion. Three-vessel coronary calcifications are noted. Calcification of the aortic leaflets as well as the aortic arch and proximal great vessels. Normal 3 vessel branching of the normal caliber aorta. Mediastinum/Nodes: No mediastinal, hilar or axillary adenopathy. Secretions are noted in the lower trachea and central bronchi. No acute abnormality of the esophagus. Small hiatal hernia. Lungs/Pleura: Extensive centrilobular and paraseptal emphysematous changes are present throughout the lungs there is diffuse airways thickening and scattered secretions throughout the smaller airways. Peripheral  regions of atelectasis  are present in the lungs right greater than left with elevation of the right hemidiaphragm. No pneumothorax or pleural fluid. Upper Abdomen: No acute abnormalities present in the visualized portions of the upper abdomen. Musculoskeletal: Extensive soft tissue stranding is noted about the right shoulder and right chest wall. Some of which is likely related to patient's recent shoulder surgery particularly the gas noted along the anterior and superior aspect of the right shoulder. There is evidence of surgical anchor placement of the greater tuberosity right humerus. There are multiple right-sided rib fractures of different ages several appear more remote in the lower right thoracic levels while fractures at the right fourth fifth and sixth ribs have a more acute to subacute appearance. Review of the MIP images confirms the above findings. IMPRESSION: 1. No evidence of pulmonary embolism. 2. Prominence of the right and left main pulmonary arteries may reflect chronic pulmonary arterial hypertension, likely related to the patient's chronic emphysematous parenchymal changes. 3. Extensive soft tissue stranding about the right shoulder and right chest wall, likely related to patient's recent shoulder surgery. 4. Multiple right-sided rib fractures of different fractures of the right fourth, fifth and sixth ribs have a more acute to subacute appearance. Correlate for point tenderness. 5. Elevation of the right hemidiaphragm with asymmetrically increased volume loss and atelectasis in the right lung. Some of this may be related to splinting or postoperative atelectasis. Additionally, secretions are present throughout the airways and right mainstem bronchus. Some of the volume loss could also be related to mucous plugging or an underlying aspiration. 6. Aortic Atherosclerosis (ICD10-I70.0) 7. Emphysema (ICD10-J43.9). These results were called by telephone at the time of interpretation on 03/04/2019 at 3:34 am to provider  [facility] , who verbally acknowledged these results. Electronically Signed   By: Lovena Le M.D.   On: 03/04/2019 03:35   Dg Chest Port 1 View  Result Date: 03/04/2019 CLINICAL DATA:  Shortness of breath, shoulder surgery performed on Wednesday. EXAM: PORTABLE CHEST 1 VIEW COMPARISON:  Radiograph January 14, 2012 FINDINGS: There is new nonspecific elevation of the right hemidiaphragm with adjacent hazy opacity likely reflecting atelectasis. Additional fine interstitial changes are similar to prior exams. The aorta is calcified. The remaining cardiomediastinal contours are unremarkable. Degenerative changes are present in the imaged spine and shoulders. Surgical clips noted in the right breast/chest wall. IMPRESSION: New nonspecific elevation of the right hemidiaphragm with adjacent hazy opacity, likely reflecting atelectasis. Electronically Signed   By: Lovena Le M.D.   On: 03/04/2019 00:23    Micro Results    Recent Results (from the past 240 hour(s))  SARS CORONAVIRUS 2 (TAT 6-24 HRS) Nasopharyngeal Nasopharyngeal Swab     Status: None   Collection Time: 03/01/19 11:25 AM   Specimen: Nasopharyngeal Swab  Result Value Ref Range Status   SARS Coronavirus 2 NEGATIVE NEGATIVE Final    Comment: (NOTE) SARS-CoV-2 target nucleic acids are NOT DETECTED. The SARS-CoV-2 RNA is generally detectable in upper and lower respiratory specimens during the acute phase of infection. Negative results do not preclude SARS-CoV-2 infection, do not rule out co-infections with other pathogens, and should not be used as the sole basis for treatment or other patient management decisions. Negative results must be combined with clinical observations, patient history, and epidemiological information. The expected result is Negative. Fact Sheet for Patients: SugarRoll.be Fact Sheet for Healthcare Providers: https://www.woods-mathews.com/ This test is not yet approved  or cleared by the [place] and  has been authorized  for detection and/or diagnosis of SARS-CoV-2 by FDA under an Emergency Use Authorization (EUA). This EUA will remain  in effect (meaning this test can be used) for the duration of the COVID-19 declaration under Section 56 4(b)(1) of the Act, 21 U.S.C. section 360bbb-3(b)(1), unless the authorization is terminated or revoked sooner. Performed at [facility], [place] [address], [place], [place] [address]   SARS CORONAVIRUS 2 (TAT 6-24 HRS) Nasopharyngeal Nasopharyngeal Swab     Status: None   Collection Time: 03/04/19  8:20 AM   Specimen: Nasopharyngeal Swab  Result Value Ref Range Status   SARS Coronavirus 2 NEGATIVE NEGATIVE Final    Comment: (NOTE) SARS-CoV-2 target nucleic acids are NOT DETECTED. The SARS-CoV-2 RNA is generally detectable in upper and lower respiratory specimens during the acute phase of infection. Negative results do not preclude SARS-CoV-2 infection, do not rule out co-infections with other pathogens, and should not be used as the sole basis for treatment or other patient management decisions. Negative results must be combined with clinical observations, patient history, and epidemiological information. The expected result is Negative. Fact Sheet for Patients: SugarRoll.be Fact Sheet for Healthcare Providers: https://www.woods-mathews.com/ This test is not yet approved or cleared by the [place] FDA and  has been authorized for detection and/or diagnosis of SARS-CoV-2 by FDA under an Emergency Use Authorization (EUA). This EUA will remain  in effect (meaning this test can be used) for the duration of the COVID-19 declaration under Section 56 4(b)(1) of the Act, 21 U.S.C. section 360bbb-3(b)(1), unless the authorization is terminated or revoked sooner. Performed at [facility], [place] [address], [place], [place] [address]        Today    Subjective    Larkin Simoneau states that she is ready to go home and does not want stay in the hospital.  She denies currently wheezing.   Objective   Blood pressure (!) 110/47, pulse 95, temperature 98.3 F (36.8 C), temperature source Oral, resp. rate 20, SpO2 96 %.   Intake/Output Summary (Last 24 hours) at 03/04/2019 1851 Last data filed at 03/04/2019 1134 Gross per 24 hour  Intake 150 ml  Output -  Net 150 ml    Exam  Constitutional: Elderly female currently in NAD, calm, comfortable Eyes: PERRL, lids and conjunctivae normal ENMT: Mucous membranes are moist. Posterior pharynx clear of any exudate or lesions.   Neck: normal, supple, no masses, no thyromegaly Respiratory: clear to auscultation bilaterally, no wheezing, no crackles. Normal respiratory effort. No accessory muscle use.  O2 saturation maintained on room air. Cardiovascular: Regular rate and rhythm, no murmurs / rubs / gallops. No extremity edema. 2+ pedal pulses. No carotid bruits.  Abdomen: no tenderness, no masses palpated. No hepatosplenomegaly. Bowel sounds positive.  Musculoskeletal: no clubbing / cyanosis.  Right arm currently in sling.  Skin: no rashes, lesions, ulcers. No induration Neurologic: CN 2-12 grossly intact. Sensation intact, DTR normal. Strength 5/5 in all 4.  Psychiatric: Normal judgment and insight. Alert and oriented x 3. Normal mood.    Data Review   CBC w Diff:  Lab Results  Component Value Date   WBC 13.5 (H) 03/03/2019   HGB 10.5 (L) 03/04/2019   HCT 31.0 (L) 03/04/2019   PLT 226 03/03/2019   LYMPHOPCT 26 03/03/2019   MONOPCT 9 03/03/2019   EOSPCT 1 03/03/2019   BASOPCT 0 03/03/2019    CMP:  Lab Results  Component Value Date   NA 134 (L) 03/04/2019  K 4.1 03/04/2019   CL 99 03/03/2019   CO2 25 03/03/2019   BUN 19 03/03/2019   CREATININE 1.14 (H) 03/03/2019   PROT 7.4 01/12/2012   ALBUMIN 4.3 01/12/2012   BILITOT 0.3 01/12/2012   ALKPHOS 50 01/12/2012   AST 16  01/12/2012   ALT 9 01/12/2012  .   Total Time in preparing paper work, data evaluation and todays exam - 35 minutes  Norval Morton M.D on 03/04/2019 at 6:51 PM  [facility]   Office  [phone]

## 2019-03-04 NOTE — ED Notes (Signed)
Pts cousin at bedside to take her home. Agreeable with plan for home.

## 2019-03-04 NOTE — ED Notes (Signed)
Heart healthy/carb modified lunch tray ordered 

## 2019-03-04 NOTE — Progress Notes (Signed)
Pharmacy Antibiotic Note  Victoria Thornton is a 78 y.o. female admitted on 03/03/2019 with aspiration pneumonia.  Pharmacy has been consulted for Zosyn dosing.  Plan: Zosyn 3.375g IV q8h (4-hour infusion).   Temp (24hrs), Avg:98.8 F (37.1 C), Min:98.8 F (37.1 C), Max:98.8 F (37.1 C)  Recent Labs  Lab 03/01/19 1130 03/03/19 2359  WBC  --  13.5*  CREATININE 0.94 1.14*    Estimated Creatinine Clearance: 41.7 mL/min (A) (by C-G formula based on SCr of 1.14 mg/dL (H)).    No Known Allergies   Thank you for allowing pharmacy to be a part of this patient's care.  Wynona Neat, PharmD, BCPS  03/04/2019 4:16 AM

## 2019-03-04 NOTE — ED Notes (Signed)
Walked patient to the bedroom patient did well patient is now back in bed with call bell in reach

## 2019-03-04 NOTE — ED Notes (Signed)
Patient Alert and oriented to baseline. Stable and ambulatory to baseline. Patient verbalized understanding of the discharge instructions.  Patient belongings were taken by the patient.   

## 2019-03-04 NOTE — ED Notes (Signed)
Called patient's daughter to let her know patient is agreeable to getting 2nd dose of IV antibiotics. Will plan to administer daily meds and d/c home if no futher O2 requirement.

## 2019-03-04 NOTE — ED Notes (Signed)
Message sent to Dr. Tamala Julian: Mrs. Victoria Thornton would either like to be discharged or sign out AMA. She stated to me and another staff member she is ready to go home and I should go ahead and call her cousin to come pick her up.

## 2019-03-04 NOTE — ED Notes (Signed)
Pt educated on use of home incentive spirometer.

## 2019-03-04 NOTE — ED Notes (Signed)
Call placed to patients daughter, patient wants to leave the hospital today. Notified her I had let the admitting Dr.know the same.

## 2019-03-04 NOTE — Care Management (Signed)
This is a no charge note  Pending admission per Dr. Andria Meuse  78 year old lady with past medical history of diabetes, hyperlipidemia, stroke, GERD, depression, mitral valve prolapse, breast cancer, former smoker, who presents with shortness of breath and wheezing.  Patient had an rotator cuff surgery on 10/14.  CT angiogram is negative for PE.  ED physician is concerned that patient may have aspiration pneumonia.  1 dose of Zosyn was given.  Clinically is more consistent with undiagnosed COPD with exacerbation.  Patient is admitted to telemetry bed as inpatient.  CTA: 1. No evidence of pulmonary embolism. 2. Prominence of the right and left main pulmonary arteries may reflect chronic pulmonary arterial hypertension, likely related to the patient's chronic emphysematous parenchymal changes. 3. Extensive soft tissue stranding about the right shoulder and right chest wall, likely related to patient's recent shoulder surgery. 4. Multiple right-sided rib fractures of different fractures of the right fourth, fifth and sixth ribs have a more acute to subacute appearance. Correlate for point tenderness. 5. Elevation of the right hemidiaphragm with asymmetrically increased volume loss and atelectasis in the right lung. Some of this may be related to splinting or postoperative atelectasis. Additionally, secretions are present throughout the airways and right mainstem bronchus. Some of the volume loss could also be related to mucous plugging or an underlying aspiration. 6. Aortic Atherosclerosis (ICD10-I70.0) 7. Emphysema (ICD10-J43.9).  Ivor Costa, MD  [facility]   If 7PM-7AM, please contact night-coverage [email or web address] Password [facility] 03/04/2019, 4:39 AM

## 2019-03-04 NOTE — ED Notes (Signed)
Pt ambulated in hallway with 02 saturations 96%. No active distress noted.

## 2019-03-04 NOTE — ED Notes (Signed)
Dr. Tamala Julian at bedside with patient.

## 2019-03-04 NOTE — H&P (Addendum)
History and Physical    [name] [id number] DOB: Dec 15, 1940 DOA: 03/03/2019  Referring MD/NP/PA: Ivor Costa, MD PCP: Reynold Bowen, MD  Patient coming from: home via EMS  Chief Complaint: Shortness of breath  I have personally briefly reviewed patient's old medical records in [place]   HPI: Victoria Thornton is a 78 y.o. female with medical history significant of HLD, chronic bronchitis, diabetes mellitus type 2, CVA,  MVP, breast cancer s/p lumpectomy, and remote history of tobacco abuse.  She presented with complaints of shortness of breath upon going to bed last night.  Normally patient does not require oxygen.  Associated symptoms include wheezing.  Patient reports that she did not have time to try an inhaler before her cousin called EMS.  Denies having any significant fever, cough, chest pain, abdominal pain, nausea, vomiting, or diarrhea symptoms.  Patient had just undergone right shoulder arthroscopy with open rotator cuff repair with Dr. Berenice Primas on 10/14.  Her cousin had been staying with her following the surgery as the patient had been living alone.  She does also admit to having fallen 2-3 months ago and then again maybe 1 month ago onto her right side.  Her primary care provider had given her lidocaine patches which help with pain.  In route with EMS reported to have O2 saturations in the 80s with wheezing. Given 125 mg of Solu-Medrol, 2 g of magnesium sulfate, and part of a DuoNeb breathing treatment.  ED Course: Upon admission into the emergency department patient was noted to be afebrile, pulse 84-102, respirations 16-23, blood pressure is maintained, and O2 saturations maintained on 2 L nasal cannula oxygen. Labs significant for WBC 13.5, hemoglobin 10.6, BUN 19, creatinine 1.14, glucose 145, and BNP 146.6.  Venous blood gas was noted to be within normal limits with CO2- 50.6.  Given recent shoulder surgery CTA chest was performed.  Imaging showed no signs of a  PE, possible aspiration pneumonia, new and old right rib fractures, and recent postoperative surgical changes of the right shoulder.  Patient was given albuterol inhaler, Atrovent inhaler, and empiric antibiotics of Zosyn for possibility of aspiration pneumonia.    Review of Systems  Constitutional: Negative for diaphoresis and fever.  HENT: Negative for congestion and nosebleeds.   Eyes: Negative for photophobia, pain and discharge.  Respiratory: Positive for wheezing. Negative for cough and shortness of breath.   Cardiovascular: Negative for chest pain and leg swelling.  Gastrointestinal: Negative for abdominal pain, blood in stool, constipation, nausea and vomiting.  Genitourinary: Negative for dysuria and hematuria.  Musculoskeletal: Positive for joint pain. Negative for falls.  Neurological: Negative for focal weakness and loss of consciousness.  Endo/Heme/Allergies: Negative for polydipsia.  Psychiatric/Behavioral: Negative for memory loss and substance abuse.    Past Medical History:  Diagnosis Date  . Breast cancer ([place])   . Chronic bronchitis   . Chronic hoarseness   . Colonic polyp   . Diabetes mellitus   . Diverticulosis of colon (without mention of hemorrhage)   . Dysthymia   . GERD (gastroesophageal reflux disease)   . MVP (mitral valve prolapse)   . Overweight(278.02)   . Personal history of radiation therapy   . Pure hypercholesterolemia   . Stroke [facility]) March 23,2012  . Unspecified vitamin D deficiency   . Vocal cord polyp     Past Surgical History:  Procedure Laterality Date  . APPENDECTOMY    . BREAST BIOPSY    . BREAST CYST ASPIRATION  1995  .  BREAST EXCISIONAL BIOPSY    . BREAST LUMPECTOMY Right    2016  . SHOULDER ARTHROSCOPY WITH OPEN ROTATOR CUFF REPAIR AND DISTAL CLAVICLE ACROMINECTOMY  03/02/2019   Procedure: SHOULDER ARTHROSCOPY WITH OPEN ROTATOR CUFF REPAIR AND DISTAL CLAVICLE ACROMINECTOMY;  Surgeon: Dorna Leitz, MD;  Location: [place];  Service: Orthopedics;;  . TONSILLECTOMY  1960  . vocal cord polyps  1998     reports that she quit smoking about 21 years ago. Her smoking use included cigarettes. She has a 40.00 pack-year smoking history. She has never used smokeless tobacco. She reports current alcohol use. She reports that she does not use drugs.  No Known Allergies  Family History  Problem Relation Age of Onset  . Prostate cancer Father   . Heart attack Father   . Heart disease Father        Heart Disease before age 19  . Diabetes Mother   . Depression Mother   . Hypertension Brother   . Multiple sclerosis Daughter     Prior to Admission medications   Medication Sig Start Date End Date Taking? Authorizing Provider  anastrozole (ARIMIDEX) 1 MG tablet Take 1 tablet (1 mg total) by mouth daily. 03/22/18  Yes Nicholas Lose, MD  Calcium Carbonate-Vitamin D (CALTRATE 600+D) 600-400 MG-UNIT per tablet Take 1 tablet by mouth 2 (two) times daily.    Yes [provider]  Cholecalciferol (VITAMIN D3) 2000 UNITS TABS Take 2,000 Units by mouth daily.    Yes [provider]  glyBURIDE (DIABETA) 5 MG tablet Take 2 tablets (10 mg total) by mouth daily with breakfast. 03/22/18  Yes Nicholas Lose, MD  HYDROcodone-acetaminophen (NORCO) 5-325 MG tablet Take 1-2 tablets by mouth every 6 (six) hours as needed for moderate pain. 03/02/19  Yes Gary Fleet, PA-C  lisinopril (PRINIVIL,ZESTRIL) 10 MG tablet Take 1 tablet (10 mg total) by mouth daily. 08/30/10  Yes Noralee Space, MD  magnesium oxide (MAG-OX) 400 (241.3 Mg) MG tablet Take 1 tablet (400 mg total) by mouth daily. 03/22/18  Yes Nicholas Lose, MD  metFORMIN (GLUCOPHAGE) 500 MG tablet Take 2 tablets (1,000 mg total) by mouth 2 (two) times daily with a meal. 05/04/12  Yes Noralee Space, MD  Multiple Vitamins-Minerals (WOMENS DAILY FORMULA PO) Take 1 capsule by mouth daily.     Yes [provider]  sertraline (ZOLOFT) 50 MG tablet Take  50 mg by mouth daily.   Yes [provider]  simvastatin (ZOCOR) 20 MG tablet Take 1 tablet (20 mg total) by mouth at bedtime. 05/03/12  Yes Noralee Space, MD  sitaGLIPtin (JANUVIA) 100 MG tablet Take 1 tablet (100 mg total) by mouth daily. 03/22/18  Yes Nicholas Lose, MD  tiZANidine (ZANAFLEX) 2 MG tablet Take 1 tablet (2 mg total) by mouth every 8 (eight) hours as needed for muscle spasms. 03/02/19  Yes Gary Fleet, PA-C  zolpidem (AMBIEN) 10 MG tablet Take 0.5 tablets (5 mg total) by mouth at bedtime as needed. Patient taking differently: Take 5 mg by mouth at bedtime as needed for sleep.  08/30/10  Yes Noralee Space, MD    Physical Exam:  Constitutional: Elderly female currently in NAD, calm, comfortable Vitals:   03/04/19 0130 03/04/19 0145 03/04/19 0200 03/04/19 0559  BP: (!) 126/51 (!) 120/49 (!) 127/54   Pulse: 98 96 97   Resp: 16 16 19    Temp:      SpO2: 96% 96% 95% 94%   Eyes: PERRL,  lids and conjunctivae normal ENMT: Mucous membranes are dry. Posterior pharynx clear of any exudate or lesions.   Neck: normal, supple, no masses, no thyromegaly Respiratory: Decreased overall aeration but no significant wheezes noted at this time.  Patient on room air maintaining O2 saturations. Cardiovascular: Regular rate and rhythm, no murmurs / rubs / gallops. No extremity edema. 2+ pedal pulses. No carotid bruits.  Abdomen: no tenderness, no masses palpated. No hepatosplenomegaly. Bowel sounds positive.  Musculoskeletal: no clubbing / cyanosis.  Right shoulder bandage and currently in a sling. Skin: Postsurgical swelling present no significant erythema appreciated.. Neurologic: CN 2-12 grossly intact. Sensation intact, DTR normal. Strength 5/5 in all 4.  Psychiatric: Normal judgment and insight. Alert and oriented x 3. Normal mood.     Labs on Admission: I have personally reviewed following labs and imaging studies  CBC: Recent Labs  Lab 03/03/19 2359 03/04/19 0047  WBC  13.5*  --   NEUTROABS 8.6*  --   HGB 10.6* 10.5*  HCT 33.6* 31.0*  MCV 92.1  --   PLT 226  --    Basic Metabolic Panel: Recent Labs  Lab 03/01/19 1130 03/03/19 2359 03/04/19 0047  NA 138 135 134*  K 4.3 4.1 4.1  CL 101 99  --   CO2 26 25  --   GLUCOSE 149* 145*  --   BUN 15 19  --   CREATININE 0.94 1.14*  --   CALCIUM 9.6 9.2  --    GFR: Estimated Creatinine Clearance: 41.7 mL/min (A) (by C-G formula based on SCr of 1.14 mg/dL (H)). Liver Function Tests: No results for input(s): AST, ALT, ALKPHOS, BILITOT, PROT, ALBUMIN in the last 168 hours. No results for input(s): LIPASE, AMYLASE in the last 168 hours. No results for input(s): AMMONIA in the last 168 hours. Coagulation Profile: No results for input(s): INR, PROTIME in the last 168 hours. Cardiac Enzymes: No results for input(s): CKTOTAL, CKMB, CKMBINDEX, TROPONINI in the last 168 hours. BNP (last 3 results) No results for input(s): PROBNP in the last 8760 hours. HbA1C: No results for input(s): HGBA1C in the last 72 hours. CBG: Recent Labs  Lab 03/02/19 1218 03/02/19 1655  GLUCAP 134* 112*   Lipid Profile: No results for input(s): CHOL, HDL, LDLCALC, TRIG, CHOLHDL, LDLDIRECT in the last 72 hours. Thyroid Function Tests: No results for input(s): TSH, T4TOTAL, FREET4, T3FREE, THYROIDAB in the last 72 hours. Anemia Panel: No results for input(s): VITAMINB12, FOLATE, FERRITIN, TIBC, IRON, RETICCTPCT in the last 72 hours. Urine analysis:    Component Value Date/Time   COLORURINE YELLOW 01/14/2012 [place] 01/14/2012 1227   LABSPEC >=1.030 01/14/2012 1227   PHURINE 5.5 01/14/2012 1227   GLUCOSEU NEGATIVE 01/14/2012 1227   HGBUR SMALL 01/14/2012 1227   BILIRUBINUR NEGATIVE 01/14/2012 1227   KETONESUR NEGATIVE 01/14/2012 1227   UROBILINOGEN 0.2 01/14/2012 1227   NITRITE NEGATIVE 01/14/2012 1227   LEUKOCYTESUR NEGATIVE 01/14/2012 1227   Sepsis Labs: Recent Results (from the past 240 hour(s))   SARS CORONAVIRUS 2 (TAT 6-24 HRS) Nasopharyngeal Nasopharyngeal Swab     Status: None   Collection Time: 03/01/19 11:25 AM   Specimen: Nasopharyngeal Swab  Result Value Ref Range Status   SARS Coronavirus 2 NEGATIVE NEGATIVE Final    Comment: (NOTE) SARS-CoV-2 target nucleic acids are NOT DETECTED. The SARS-CoV-2 RNA is generally detectable in upper and lower respiratory specimens during the acute phase of infection. Negative results do not preclude SARS-CoV-2 infection, do not rule out  co-infections with other pathogens, and should not be used as the sole basis for treatment or other patient management decisions. Negative results must be combined with clinical observations, patient history, and epidemiological information. The expected result is Negative. Fact Sheet for Patients: SugarRoll.be Fact Sheet for Healthcare Providers: https://www.woods-mathews.com/ This test is not yet approved or cleared by the [place] FDA and  has been authorized for detection and/or diagnosis of SARS-CoV-2 by FDA under an Emergency Use Authorization (EUA). This EUA will remain  in effect (meaning this test can be used) for the duration of the COVID-19 declaration under Section 56 4(b)(1) of the Act, 21 U.S.C. section 360bbb-3(b)(1), unless the authorization is terminated or revoked sooner. Performed at [facility], [place] [address] [place], [place] [address]      Radiological Exams on Admission: Ct Angio Chest Pe W And/or Wo Contrast  Result Date: 03/04/2019 CLINICAL DATA:  PE suspected, shortness of breath and wheezing for 1 day. EXAM: CT ANGIOGRAPHY CHEST WITH CONTRAST TECHNIQUE: Multidetector CT imaging of the chest was performed using the standard protocol during bolus administration of intravenous contrast. Multiplanar CT image reconstructions and MIPs were obtained to evaluate the vascular anatomy. CONTRAST:  19mL OMNIPAQUE IOHEXOL 350  MG/ML SOLN COMPARISON:  Radiograph 03/04/2019 FINDINGS: Cardiovascular: Satisfactory opacification of the pulmonary arteries to the segmental level. No pulmonary arterial filling defects are identified. There is prominence of the right and left main pulmonary arteries which may reflect chronic pulmonary arterial hypertension. No elevation of the RV/LV ratio (0.8). Normal heart size. No pericardial effusion. Three-vessel coronary calcifications are noted. Calcification of the aortic leaflets as well as the aortic arch and proximal great vessels. Normal 3 vessel branching of the normal caliber aorta. Mediastinum/Nodes: No mediastinal, hilar or axillary adenopathy. Secretions are noted in the lower trachea and central bronchi. No acute abnormality of the esophagus. Small hiatal hernia. Lungs/Pleura: Extensive centrilobular and paraseptal emphysematous changes are present throughout the lungs there is diffuse airways thickening and scattered secretions throughout the smaller airways. Peripheral regions of atelectasis are present in the lungs right greater than left with elevation of the right hemidiaphragm. No pneumothorax or pleural fluid. Upper Abdomen: No acute abnormalities present in the visualized portions of the upper abdomen. Musculoskeletal: Extensive soft tissue stranding is noted about the right shoulder and right chest wall. Some of which is likely related to patient's recent shoulder surgery particularly the gas noted along the anterior and superior aspect of the right shoulder. There is evidence of surgical anchor placement of the greater tuberosity right humerus. There are multiple right-sided rib fractures of different ages several appear more remote in the lower right thoracic levels while fractures at the right fourth fifth and sixth ribs have a more acute to subacute appearance. Review of the MIP images confirms the above findings. IMPRESSION: 1. No evidence of pulmonary embolism. 2. Prominence of the  right and left main pulmonary arteries may reflect chronic pulmonary arterial hypertension, likely related to the patient's chronic emphysematous parenchymal changes. 3. Extensive soft tissue stranding about the right shoulder and right chest wall, likely related to patient's recent shoulder surgery. 4. Multiple right-sided rib fractures of different fractures of the right fourth, fifth and sixth ribs have a more acute to subacute appearance. Correlate for point tenderness. 5. Elevation of the right hemidiaphragm with asymmetrically increased volume loss and atelectasis in the right lung. Some of this may be related to splinting or postoperative atelectasis. Additionally, secretions are present throughout the airways and right  mainstem bronchus. Some of the volume loss could also be related to mucous plugging or an underlying aspiration. 6. Aortic Atherosclerosis (ICD10-I70.0) 7. Emphysema (ICD10-J43.9). These results were called by telephone at the time of interpretation on 03/04/2019 at 3:34 am to provider [facility] , who verbally acknowledged these results. Electronically Signed   By: Lovena Le M.D.   On: 03/04/2019 03:35   Dg Chest Port 1 View  Result Date: 03/04/2019 CLINICAL DATA:  Shortness of breath, shoulder surgery performed on Wednesday. EXAM: PORTABLE CHEST 1 VIEW COMPARISON:  Radiograph January 14, 2012 FINDINGS: There is new nonspecific elevation of the right hemidiaphragm with adjacent hazy opacity likely reflecting atelectasis. Additional fine interstitial changes are similar to prior exams. The aorta is calcified. The remaining cardiomediastinal contours are unremarkable. Degenerative changes are present in the imaged spine and shoulders. Surgical clips noted in the right breast/chest wall. IMPRESSION: New nonspecific elevation of the right hemidiaphragm with adjacent hazy opacity, likely reflecting atelectasis. Electronically Signed   By: Lovena Le M.D.   On: 03/04/2019 00:23     EKG: Independently reviewed. Sinus rhythm at 84 bpm   Assessment/Plan Respiratory failure with hypoxia, suspected COPD exacerbation, possible aspiration pneumonia: Acute.  Patient presents with worsening shortness of breath initially found to have O2 saturations in 80s on room air.  CTA of the chest revealing no signs of PE, but did show mucous plugging versus aspiration.  Patient had already received 125 mg Solu-Medrol with EMS in addition to breathing treatments.  She was also started on empiric antibiotics of Zosyn given elevated white count with concern for aspiration. -Admit to the MedSurg -Continuous pulse oximetry with nasal cannula oxygen as needed to maintain O2 saturation -Check pro-calcitonin   -COPD order-set utilized -Continue Zosyn for now -Levalbuterol and ipratropium nebs 4 times daily and as needed -Prednisone 40 mg po day -PT/OT to eval and treat -Antitussives as needed   SIRS: Acute.  Patient initially was found to be tachycardic and tachypneic with WBC elevated up to 13.5.  No initial lactic acid was obtained.  Suspect leukocytosis this is likely to patient's recent operation. -Recheck CBC in a.m.  Normocytic anemia: Hemoglobin dropped down to 10.6 g/dL after recent shoulder surgery.  No reports of active bleeding. -Continue to monitor  Renal insufficiency: Acute.  On admission creatinine mildly elevated to 1.14, but baseline previously around 0.9. -Continue to monitor  History of right shoulder surgery: Status post right shoulder arthroscopy with open rotator cuff repair with Dr. Berenice Primas on 10/14.  -Continue hydrocodone -Continue outpatient follow-up   Right rib fracture: CTA showed old and new right rib fractures. -Incentive spirometry    Diabetes mellitus type 2: On admission blood glucose 145.  Home medications include glyburide, metformin, and sitagliptin.  No recent hemoglobin A1c on file. -Hypoglycemic protocol -Hold oral medications -CBGs q AC and HS  with sensitive SSI  Essential hypertension: Blood pressure is currently stable. -Continue lisinopril  History of breast cancer: Right breast DCIS s/p lumpectomy.  Followed by Dr. Lindi Adie in outpatient setting. -Continue anastrozole -Continue outpatient follow-up  DVT prophylaxis: Lovenox Code Status: Full Family Communication: None Disposition Plan: Likely discharge home in 1-2 days Consults called: None  Admission status: inpatient  Norval Morton MD [facility] Pager [phone]   If 7PM-7AM, please contact night-coverage [email or web address] Password [email or web address]  03/04/2019, 7:06 AM

## 2019-03-19 ENCOUNTER — Other Ambulatory Visit: Payer: Self-pay | Admitting: Hematology and Oncology

## 2019-03-27 NOTE — Progress Notes (Signed)
Patient Care Team: Reynold Bowen, MD as PCP - General (Endocrinology)  DIAGNOSIS:    ICD-10-CM   1. Ductal carcinoma in situ (DCIS) of right breast  D05.11     SUMMARY OF ONCOLOGIC HISTORY: Oncology History  Ductal carcinoma in situ (DCIS) of right breast  07/26/2014 Initial Diagnosis   Stereotactic biopsy right breast 12:00: Intraductal hyperplasia with no atypia, 18 mm group of calcifications 8 o'clock position high-grade DCIS ER 95% PR 60% positive at [facility]   08/09/2014 Surgery   Right lumpectomy followed by reexcision for negative margins   11/01/2014 - 12/01/2014 Radiation Therapy   Adjuvant radiation therapy   01/01/2015 -  Anti-estrogen oral therapy   Anastrozole 1 mg daily     CHIEF COMPLIANT: Follow-up of right breast DCIS on anastrozole  INTERVAL HISTORY: Victoria Thornton is a 78 y.o. with above-mentioned history of right breast DCIS treated with a lumpectomy, radiation, and who is currently on anti-estrogen therapy with anastrozole. I last saw her a year ago. Mammogram on 05/06/18 showed no evidence of malignancy bilaterally. She presents to the clinic today for annual follow-up.  She had rotator cuff surgery and is wearing a shoulder sling.  She also fell recently on her driveway and the heart around the eye and has bruises around that.  She denies any major problems tolerating the anastrozole pill.  REVIEW OF SYSTEMS:   Constitutional: Denies fevers, chills or abnormal weight loss Eyes: Denies blurriness of vision Ears, nose, mouth, throat, and face: Denies mucositis or sore throat Respiratory: Denies cough, dyspnea or wheezes Cardiovascular: Denies palpitation, chest discomfort Gastrointestinal: Denies nausea, heartburn or change in bowel habits Skin: Denies abnormal skin rashes Lymphatics: Denies new lymphadenopathy or easy bruising Neurological: Denies numbness, tingling or new weaknesses Behavioral/Psych: Mood is stable, no new  changes  Extremities: No lower extremity edema Breast: denies any pain or lumps or nodules in either breasts All other systems were reviewed with the patient and are negative.  I have reviewed the past medical history, past surgical history, social history and family history with the patient and they are unchanged from previous note.  ALLERGIES:  has No Known Allergies.  MEDICATIONS:  Current Outpatient Medications  Medication Sig Dispense Refill  . anastrozole (ARIMIDEX) 1 MG tablet Take 1 tablet (1 mg total) by mouth daily. 90 tablet 3  . Calcium Carbonate-Vitamin D (CALTRATE 600+D) 600-400 MG-UNIT per tablet Take 1 tablet by mouth 2 (two) times daily.     . Cholecalciferol (VITAMIN D3) 2000 UNITS TABS Take 2,000 Units by mouth daily.     Marland Kitchen glyBURIDE (DIABETA) 5 MG tablet Take 2 tablets (10 mg total) by mouth daily with breakfast.    . lisinopril (PRINIVIL,ZESTRIL) 10 MG tablet Take 1 tablet (10 mg total) by mouth daily. 90 tablet 3  . magnesium oxide (MAG-OX) 400 (241.3 Mg) MG tablet Take 1 tablet (400 mg total) by mouth daily.    . metFORMIN (GLUCOPHAGE) 500 MG tablet Take 2 tablets (1,000 mg total) by mouth 2 (two) times daily with a meal. 180 tablet 1  . Multiple Vitamins-Minerals (WOMENS DAILY FORMULA PO) Take 1 capsule by mouth daily.      . sertraline (ZOLOFT) 50 MG tablet Take 50 mg by mouth daily.    . simvastatin (ZOCOR) 20 MG tablet Take 1 tablet (20 mg total) by mouth at bedtime. 90 tablet 0  . sitaGLIPtin (JANUVIA) 100 MG tablet Take 1 tablet (100 mg total) by mouth daily.    Marland Kitchen  zolpidem (AMBIEN) 10 MG tablet Take 0.5 tablets (5 mg total) by mouth at bedtime as needed for sleep.     No current facility-administered medications for this visit.     PHYSICAL EXAMINATION: ECOG PERFORMANCE STATUS: 1 - Symptomatic but completely ambulatory  Vitals:   03/28/19 1419  BP: (!) 144/68  Pulse: 77  Resp: 16  Temp: 98.9 F (37.2 C)  SpO2: 98%   Filed Weights   03/28/19 1419   Weight: 169 lb 9.6 oz (76.9 kg)    GENERAL: alert, no distress and comfortable SKIN: skin color, texture, turgor are normal, no rashes or significant lesions EYES: normal, Conjunctiva are pink and non-injected, sclera clear OROPHARYNX: no exudate, no erythema and lips, buccal mucosa, and tongue normal  NECK: supple, thyroid normal size, non-tender, without nodularity LYMPH: no palpable lymphadenopathy in the cervical, axillary or inguinal LUNGS: clear to auscultation and percussion with normal breathing effort HEART: regular rate & rhythm and no murmurs and no lower extremity edema ABDOMEN: abdomen soft, non-tender and normal bowel sounds MUSCULOSKELETAL: no cyanosis of digits and no clubbing  NEURO: alert & oriented x 3 with fluent speech, no focal motor/sensory deficits EXTREMITIES: No lower extremity edema BREAST: No palpable masses or nodules in either right or left breasts. No palpable axillary supraclavicular or infraclavicular adenopathy no breast tenderness or nipple discharge. (exam performed in the presence of a chaperone)  LABORATORY DATA:  I have reviewed the data as listed CMP Latest Ref Rng & Units 03/04/2019 03/03/2019 03/01/2019  Glucose 70 - 99 mg/dL - 145(H) 149(H)  BUN 8 - 23 mg/dL - 19 15  Creatinine 0.44 - 1.00 mg/dL - 1.14(H) 0.94  Sodium 135 - 145 mmol/L 134(L) 135 138  Potassium 3.5 - 5.1 mmol/L 4.1 4.1 4.3  Chloride 98 - 111 mmol/L - 99 101  CO2 22 - 32 mmol/L - 25 26  Calcium 8.9 - 10.3 mg/dL - 9.2 9.6  Total Protein 6.0 - 8.3 g/dL - - -  Total Bilirubin 0.3 - 1.2 mg/dL - - -  Alkaline Phos 39 - 117 U/L - - -  AST 0 - 37 U/L - - -  ALT 0 - 35 U/L - - -    Lab Results  Component Value Date   WBC 13.5 (H) 03/03/2019   HGB 10.5 (L) 03/04/2019   HCT 31.0 (L) 03/04/2019   MCV 92.1 03/03/2019   PLT 226 03/03/2019   NEUTROABS 8.6 (H) 03/03/2019    ASSESSMENT & PLAN:  Ductal carcinoma in situ (DCIS) of right breast 07/26/2014:Stereotactic biopsy right  breast 12:00: Intraductal hyperplasia with no atypia, 18 mm group of calcifications 8 o'clock position high-grade DCIS ER 95% PR 60% positive at [facility] Status post breast conserving surgery followed by adjuvant radiation  Current treatment: Anastrozole 1 mg daily started September 2016 Anastrozole toxicities: Patient does not have any side effects to anastrozole.  I renewed her prescription for anastrozole. Breast cancer surveillance: Mammograms will need to be done in December 2019   Hospitalization 03/03/2019 to 03/04/2019: Hypoxia SIRS Return to clinic in 1 year for follow-up    No orders of the defined types were placed in this encounter.  The patient has a good understanding of the overall plan. she agrees with it. she will call with any problems that may develop before the next visit here.  Nicholas Lose, MD 03/28/2019  Julious Oka Dorshimer am acting as scribe for Dr. Nicholas Lose.  I have reviewed the above  documentation for accuracy and completeness, and I agree with the above.

## 2019-03-28 ENCOUNTER — Other Ambulatory Visit: Payer: Self-pay

## 2019-03-28 ENCOUNTER — Other Ambulatory Visit: Payer: Self-pay | Admitting: Hematology and Oncology

## 2019-03-28 ENCOUNTER — Inpatient Hospital Stay: Payer: Medicare Other | Attending: Hematology and Oncology | Admitting: Hematology and Oncology

## 2019-03-28 DIAGNOSIS — Z9889 Other specified postprocedural states: Secondary | ICD-10-CM

## 2019-03-28 DIAGNOSIS — Z7984 Long term (current) use of oral hypoglycemic drugs: Secondary | ICD-10-CM | POA: Diagnosis not present

## 2019-03-28 DIAGNOSIS — D0511 Intraductal carcinoma in situ of right breast: Secondary | ICD-10-CM | POA: Insufficient documentation

## 2019-03-28 DIAGNOSIS — Z79899 Other long term (current) drug therapy: Secondary | ICD-10-CM | POA: Diagnosis not present

## 2019-03-28 DIAGNOSIS — Z79811 Long term (current) use of aromatase inhibitors: Secondary | ICD-10-CM | POA: Insufficient documentation

## 2019-03-28 MED ORDER — ANASTROZOLE 1 MG PO TABS: 1.0000 mg | ORAL_TABLET | Freq: Every day | ORAL | 3 refills | Status: DC

## 2019-03-28 NOTE — Assessment & Plan Note (Signed)
07/26/2014:Stereotactic biopsy right breast 12:00: Intraductal hyperplasia with no atypia, 18 mm group of calcifications 8 o'clock position high-grade DCIS ER 95% PR 60% positive at [facility] [place] Status post breast conserving surgery followed by adjuvant radiation  Current treatment: Anastrozole 1 mg daily started September 2016 Anastrozole toxicities: Patient does not have any side effects to anastrozole.  I renewed her prescription for anastrozole. Breast cancer surveillance: Mammograms will need to be done in December 2019 I set her up at the breast center.  Hospitalization 03/03/2019 to 03/04/2019: Hypoxia SIRS Return to clinic in 1 year for follow-up

## 2019-03-29 ENCOUNTER — Telehealth: Payer: Self-pay | Admitting: Hematology and Oncology

## 2019-03-29 NOTE — Telephone Encounter (Signed)
I talk with patient regarding schedule  

## 2019-05-09 ENCOUNTER — Other Ambulatory Visit: Payer: Self-pay

## 2019-05-09 ENCOUNTER — Ambulatory Visit
Admission: RE | Admit: 2019-05-09 | Discharge: 2019-05-09 | Disposition: A | Discharge status: Home / Self Care | Payer: Medicare Other | Source: Ambulatory Visit | Attending: Hematology and Oncology | Admitting: Hematology and Oncology

## 2019-05-09 DIAGNOSIS — Z9889 Other specified postprocedural states: Secondary | ICD-10-CM

## 2020-03-02 ENCOUNTER — Other Ambulatory Visit: Payer: Self-pay | Admitting: Hematology and Oncology

## 2020-03-27 ENCOUNTER — Inpatient Hospital Stay: Payer: Medicare PPO | Admitting: Hematology and Oncology

## 2020-03-27 NOTE — Assessment & Plan Note (Deleted)
07/26/2014:Stereotactic biopsy right breast 12:00: Intraductal hyperplasia with no atypia, 18 mm group of calcifications 8 o'clock position high-grade DCIS ER 95% PR 60% positive at [facility] [place] Status post breast conserving surgery followed by adjuvant radiation  Current treatment: Anastrozole 1 mg daily started September 2016 Anastrozole toxicities: Patient does not have any side effects to anastrozole.  I renewed her prescription for anastrozole. Breast cancer surveillance: Mammograms will need to be done in December 2019   Hospitalization 03/03/2019 to 03/04/2019: Hypoxia SIRS Return to clinic in 1 year for follow-up

## 2020-03-28 ENCOUNTER — Other Ambulatory Visit: Payer: Self-pay | Admitting: Hematology and Oncology

## 2020-03-28 DIAGNOSIS — Z9889 Other specified postprocedural states: Secondary | ICD-10-CM

## 2020-05-15 ENCOUNTER — Ambulatory Visit
Admission: RE | Admit: 2020-05-15 | Discharge: 2020-05-15 | Disposition: A | Discharge status: Home / Self Care | Payer: Medicare PPO | Source: Ambulatory Visit | Attending: Hematology and Oncology | Admitting: Hematology and Oncology

## 2020-05-15 ENCOUNTER — Other Ambulatory Visit: Payer: Self-pay

## 2020-05-15 DIAGNOSIS — Z9889 Other specified postprocedural states: Secondary | ICD-10-CM

## 2020-05-20 NOTE — Progress Notes (Incomplete)
Patient Care Team: Reynold Bowen, MD as PCP - General (Endocrinology)  DIAGNOSIS: No diagnosis found.  SUMMARY OF ONCOLOGIC HISTORY: Oncology History  Ductal carcinoma in situ (DCIS) of right breast  07/26/2014 Initial Diagnosis   Stereotactic biopsy right breast 12:00: Intraductal hyperplasia with no atypia, 18 mm group of calcifications 8 o'clock position high-grade DCIS ER 95% PR 60% positive at [facility]   08/09/2014 Surgery   Right lumpectomy followed by reexcision for negative margins   11/01/2014 - 12/01/2014 Radiation Therapy   Adjuvant radiation therapy   01/01/2015 -  Anti-estrogen oral therapy   Anastrozole 1 mg daily     CHIEF COMPLIANT: Follow-up of right breast DCIS on anastrozole  INTERVAL HISTORY: Victoria Thornton is a 80 y.o. with above-mentioned history of right breast DCIS treated with a lumpectomy, radiation, and who is currently on anti-estrogen therapy with anastrozole. Mammogram on 05/15/20 showed no evidence of malignancy bilaterally. She presents to the clinic today for annual follow-up.   ALLERGIES:  has No Known Allergies.  MEDICATIONS:  Current Outpatient Medications  Medication Sig Dispense Refill  . anastrozole (ARIMIDEX) 1 MG tablet TAKE 1 TABLET BY MOUTH EVERY DAY 90 tablet 3  . Calcium Carbonate-Vitamin D (CALTRATE 600+D) 600-400 MG-UNIT per tablet Take 1 tablet by mouth 2 (two) times daily.     . Cholecalciferol (VITAMIN D3) 2000 UNITS TABS Take 2,000 Units by mouth daily.     Marland Kitchen glyBURIDE (DIABETA) 5 MG tablet Take 2 tablets (10 mg total) by mouth daily with breakfast.    . lisinopril (PRINIVIL,ZESTRIL) 10 MG tablet Take 1 tablet (10 mg total) by mouth daily. 90 tablet 3  . magnesium oxide (MAG-OX) 400 (241.3 Mg) MG tablet Take 1 tablet (400 mg total) by mouth daily.    . metFORMIN (GLUCOPHAGE) 500 MG tablet Take 2 tablets (1,000 mg total) by mouth 2 (two) times daily with a meal. 180 tablet 1  . Multiple Vitamins-Minerals  (WOMENS DAILY FORMULA PO) Take 1 capsule by mouth daily.      . sertraline (ZOLOFT) 50 MG tablet Take 50 mg by mouth daily.    . simvastatin (ZOCOR) 20 MG tablet Take 1 tablet (20 mg total) by mouth at bedtime. 90 tablet 0  . sitaGLIPtin (JANUVIA) 100 MG tablet Take 1 tablet (100 mg total) by mouth daily.    Marland Kitchen zolpidem (AMBIEN) 10 MG tablet Take 0.5 tablets (5 mg total) by mouth at bedtime as needed for sleep.     No current facility-administered medications for this visit.    PHYSICAL EXAMINATION: ECOG PERFORMANCE STATUS: {CHL ONC ECOG PS:[id number]}  There were no vitals filed for this visit. There were no vitals filed for this visit.  BREAST:*** No palpable masses or nodules in either right or left breasts. No palpable axillary supraclavicular or infraclavicular adenopathy no breast tenderness or nipple discharge. (exam performed in the presence of a chaperone)  LABORATORY DATA:  I have reviewed the data as listed CMP Latest Ref Rng & Units 03/04/2019 03/03/2019 03/01/2019  Glucose 70 - 99 mg/dL - 145(H) 149(H)  BUN 8 - 23 mg/dL - 19 15  Creatinine 0.44 - 1.00 mg/dL - 1.14(H) 0.94  Sodium 135 - 145 mmol/L 134(L) 135 138  Potassium 3.5 - 5.1 mmol/L 4.1 4.1 4.3  Chloride 98 - 111 mmol/L - 99 101  CO2 22 - 32 mmol/L - 25 26  Calcium 8.9 - 10.3 mg/dL - 9.2 9.6  Total Protein 6.0 - 8.3 g/dL - - -  Total Bilirubin 0.3 - 1.2 mg/dL - - -  Alkaline Phos 39 - 117 U/L - - -  AST 0 - 37 U/L - - -  ALT 0 - 35 U/L - - -    Lab Results  Component Value Date   WBC 13.5 (H) 03/03/2019   HGB 10.5 (L) 03/04/2019   HCT 31.0 (L) 03/04/2019   MCV 92.1 03/03/2019   PLT 226 03/03/2019   NEUTROABS 8.6 (H) 03/03/2019    ASSESSMENT & PLAN:  No problem-specific Assessment & Plan notes found for this encounter.    No orders of the defined types were placed in this encounter.  The patient has a good understanding of the overall plan. she agrees with it. she will call with any problems that  may develop before the next visit here.  Total time spent: *** mins including face to face time and time spent for planning, charting and coordination of care  Serena Croissant, MD 05/20/2020  I, Kirt Boys Dorshimer, am acting as scribe for Dr. Serena Croissant.  {insert scribe attestation}

## 2020-05-21 ENCOUNTER — Telehealth: Payer: Self-pay | Admitting: Hematology and Oncology

## 2020-05-21 ENCOUNTER — Ambulatory Visit: Payer: Self-pay | Admitting: Hematology and Oncology

## 2020-05-21 NOTE — Telephone Encounter (Signed)
Rescheduled appointment per 1/3 schedule message. Patient is aware of changes. 

## 2020-05-21 NOTE — Assessment & Plan Note (Deleted)
07/26/2014:Stereotactic biopsy right breast 12:00: Intraductal hyperplasia with no atypia, 18 mm group of calcifications 8 o'clock position high-grade DCIS ER 95% PR 60% positive at [facility] [place] Status post breast conserving surgery followed by adjuvant radiation  Current treatment: Anastrozole 1 mg daily started September 2016 Anastrozole toxicities: Patient does not have any side effects to anastrozole.  I renewed her prescription for anastrozole.  Breast cancer surveillance:  1. Mammograms 05/15/2020: Benign breast density category C 2. Breast exam 05/21/2020: Benign  Hospitalization 03/03/2019 to 03/04/2019: Hypoxia SIRS Return to clinic in 1 year for follow-up

## 2020-05-25 ENCOUNTER — Telehealth: Payer: Self-pay | Admitting: *Deleted

## 2020-05-25 NOTE — Telephone Encounter (Signed)
Received VM from pt.  Attempt x1 to contact pt, no answer, LVM to return call to the office.  

## 2020-06-01 ENCOUNTER — Telehealth: Payer: Self-pay | Admitting: Hematology and Oncology

## 2020-06-01 NOTE — Telephone Encounter (Signed)
Changed 1/17 appt to a mychart visit. Called pt and left a msg

## 2020-06-01 NOTE — Assessment & Plan Note (Signed)
07/26/2014:Stereotactic biopsy right breast 12:00: Intraductal hyperplasia with no atypia, 18 mm group of calcifications 8 o'clock position high-grade DCIS ER 95% PR 60% positive at [facility] Status post breast conserving surgery followed by adjuvant radiation  Current treatment: Anastrozole 1 mg daily started September 2016 Anastrozole toxicities: Patient does not have any side effects to anastrozole.  I renewed her prescription for anastrozole. Breast cancer surveillance: Mammograms 05/15/2020: Benign breast density category C  Hospitalization 03/03/2019 to 03/04/2019: Hypoxia SIRS Return to clinic in 1 year for follow-up

## 2020-06-03 NOTE — Progress Notes (Signed)
HEMATOLOGY-ONCOLOGY MYCHART VIDEO VISIT PROGRESS NOTE  I connected with Victoria Thornton on 06/04/2020 at  2:00 PM EST by MyChart video conference and verified that I am speaking with the correct person using two identifiers.  I discussed the limitations, risks, security and privacy concerns of performing an evaluation and management service by MyChart and the availability of in person appointments.  I also discussed with the patient that there may be a patient responsible charge related to this service. The patient expressed understanding and agreed to proceed.  Patient's Location: Home Physician Location: Clinic  CHIEF COMPLIANT: Follow-up of right breast DCIS on anastrozole  INTERVAL HISTORY: Victoria Thornton is a 80 y.o. female with above-mentioned history of right breast DCIS treated with a lumpectomy, radiation, and who is currently on anti-estrogen therapy with anastrozole. Mammogram on 05/15/20 showed no evidence of malignancy bilaterally. She presents over MyChart today for annual follow-up.   She denies any adverse effects to anastrozole therapy.  Denies any hot flashes or arthralgias or myalgias.  She has a bad knee on the left and thinks that she will need a knee replacement surgery.  Oncology History  Ductal carcinoma in situ (DCIS) of right breast  07/26/2014 Initial Diagnosis   Stereotactic biopsy right breast 12:00: Intraductal hyperplasia with no atypia, 18 mm group of calcifications 8 o'clock position high-grade DCIS ER 95% PR 60% positive at [facility]   08/09/2014 Surgery   Right lumpectomy followed by reexcision for negative margins   11/01/2014 - 12/01/2014 Radiation Therapy   Adjuvant radiation therapy   01/01/2015 -  Anti-estrogen oral therapy   Anastrozole 1 mg daily     Observations/Objective:  There were no vitals filed for this visit. There is no height or weight on file to calculate BMI.  I have reviewed the data as listed CMP Latest  Ref Rng & Units 03/04/2019 03/03/2019 03/01/2019  Glucose 70 - 99 mg/dL - 145(H) 149(H)  BUN 8 - 23 mg/dL - 19 15  Creatinine 0.44 - 1.00 mg/dL - 1.14(H) 0.94  Sodium 135 - 145 mmol/L 134(L) 135 138  Potassium 3.5 - 5.1 mmol/L 4.1 4.1 4.3  Chloride 98 - 111 mmol/L - 99 101  CO2 22 - 32 mmol/L - 25 26  Calcium 8.9 - 10.3 mg/dL - 9.2 9.6  Total Protein 6.0 - 8.3 g/dL - - -  Total Bilirubin 0.3 - 1.2 mg/dL - - -  Alkaline Phos 39 - 117 U/L - - -  AST 0 - 37 U/L - - -  ALT 0 - 35 U/L - - -    Lab Results  Component Value Date   WBC 13.5 (H) 03/03/2019   HGB 10.5 (L) 03/04/2019   HCT 31.0 (L) 03/04/2019   MCV 92.1 03/03/2019   PLT 226 03/03/2019   NEUTROABS 8.6 (H) 03/03/2019      Assessment Plan:  Ductal carcinoma in situ (DCIS) of right breast 07/26/2014:Stereotactic biopsy right breast 12:00: Intraductal hyperplasia with no atypia, 18 mm group of calcifications 8 o'clock position high-grade DCIS ER 95% PR 60% positive at [facility] Status post breast conserving surgery followed by adjuvant radiation  Current treatment: Anastrozole 1 mg daily started September 2016 completed January 2022 Anastrozole toxicities: Patient does not have any side effects to anastrozole. I discussed with her that since she completed over 5 years of antiestrogen therapy she cannot discontinue it.   Breast cancer surveillance: Mammograms 05/15/2020: Benign breast density category C  Hospitalization 03/03/2019 to 03/04/2019: Hypoxia SIRS Knee Pain: might need knee replacement on left  Return to clinic in 1 year for follow-up  I discussed the assessment and treatment plan with the patient. The patient was provided an opportunity to ask questions and all were answered. The patient agreed with the plan and demonstrated an understanding of the instructions. The patient was advised to call back or seek an in-person evaluation if the symptoms worsen or if the condition fails to improve  as anticipated.   I provided 20 minutes of face-to-face MyChart video visit time during this encounter.    Rulon Eisenmenger, MD 06/04/2020   I, Molly Dorshimer, am acting as scribe for Nicholas Lose, MD.  I have reviewed the above documentation for accuracy and completeness, and I agree with the above.

## 2020-06-04 ENCOUNTER — Telehealth (HOSPITAL_BASED_OUTPATIENT_CLINIC_OR_DEPARTMENT_OTHER): Payer: Medicare PPO | Admitting: Hematology and Oncology

## 2020-06-04 DIAGNOSIS — D0511 Intraductal carcinoma in situ of right breast: Secondary | ICD-10-CM

## 2020-07-30 ENCOUNTER — Other Ambulatory Visit: Payer: Self-pay | Admitting: Orthopedic Surgery

## 2020-07-30 DIAGNOSIS — M5442 Lumbago with sciatica, left side: Secondary | ICD-10-CM

## 2020-08-20 ENCOUNTER — Other Ambulatory Visit: Payer: Self-pay

## 2020-08-20 ENCOUNTER — Ambulatory Visit
Admission: RE | Admit: 2020-08-20 | Discharge: 2020-08-20 | Disposition: A | Discharge status: Home / Self Care | Payer: Medicare PPO | Source: Ambulatory Visit | Attending: Orthopedic Surgery | Admitting: Orthopedic Surgery

## 2020-08-20 DIAGNOSIS — M5442 Lumbago with sciatica, left side: Secondary | ICD-10-CM

## 2020-10-15 IMAGING — MG DIGITAL DIAGNOSTIC BILAT W/ TOMO W/ CAD
8 of 12 series · 8 of 32 positions shown · non-contrast
Comparison: Previous exam(s).

CLINICAL DATA: Malignant lumpectomy of RIGHT breast in 8824 with
adjuvant radiation therapy.Annual evaluation. Personal history of
remote benign excisional biopsy from the LEFT breast.

EXAM:
DIGITAL DIAGNOSTIC BILATERAL MAMMOGRAM WITH CAD AND TOMO

[R MLO (1 of 2)]
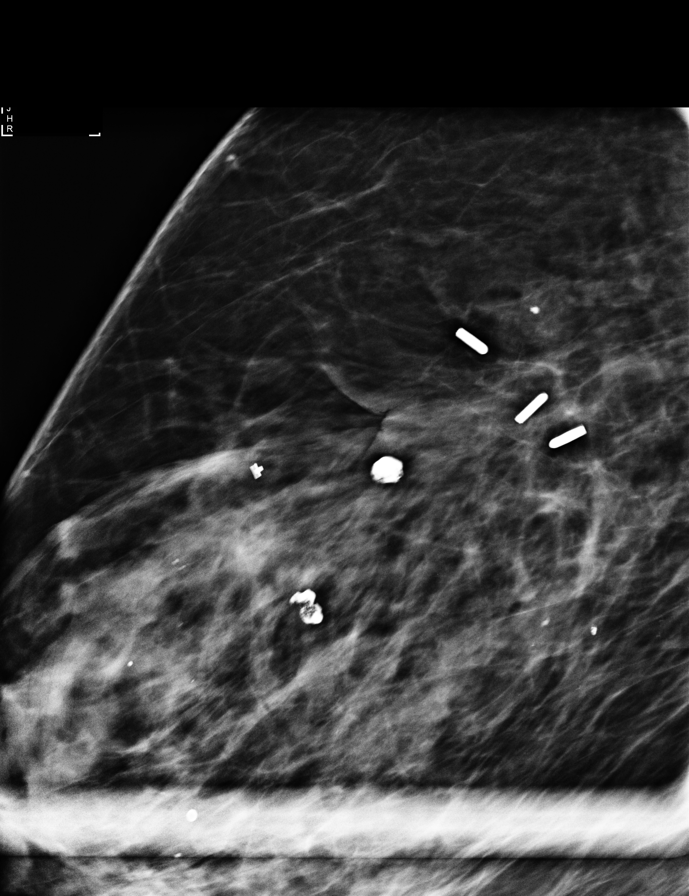

[R MLO (2 of 2)]
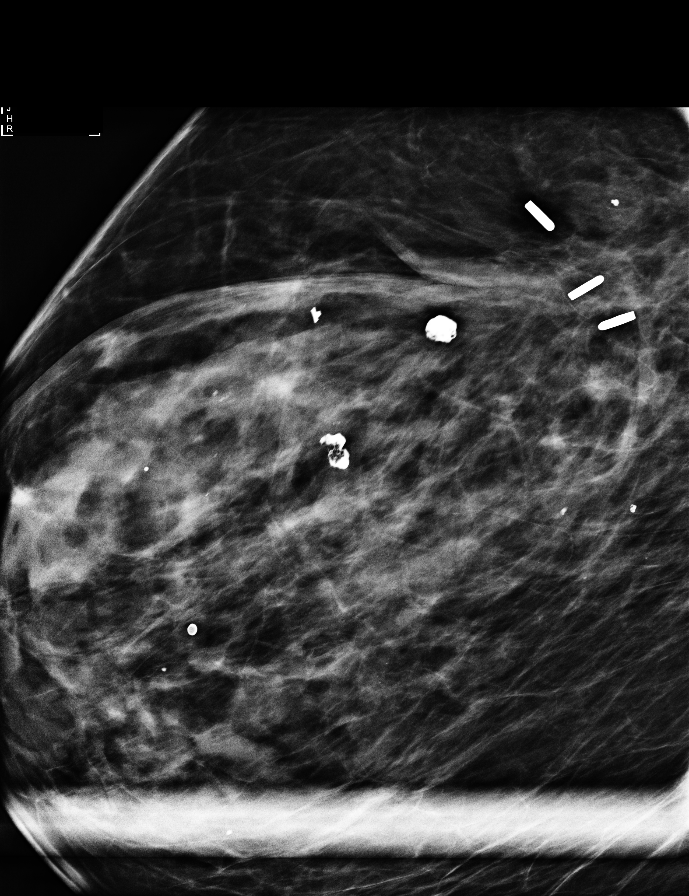

[L CC synth-2D]
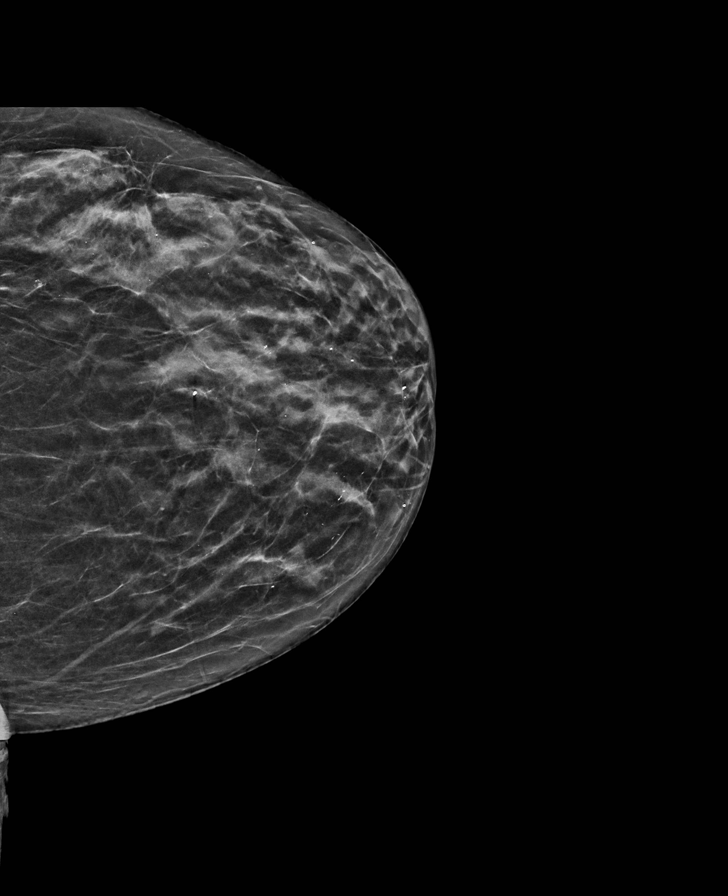

[R CC synth-2D]
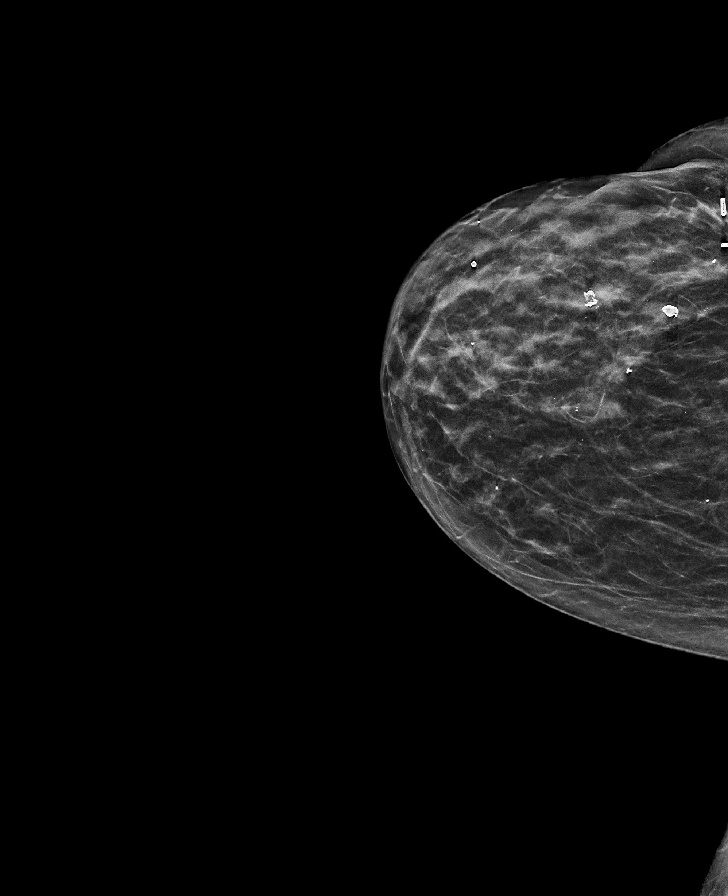

[L MLO synth-2D]
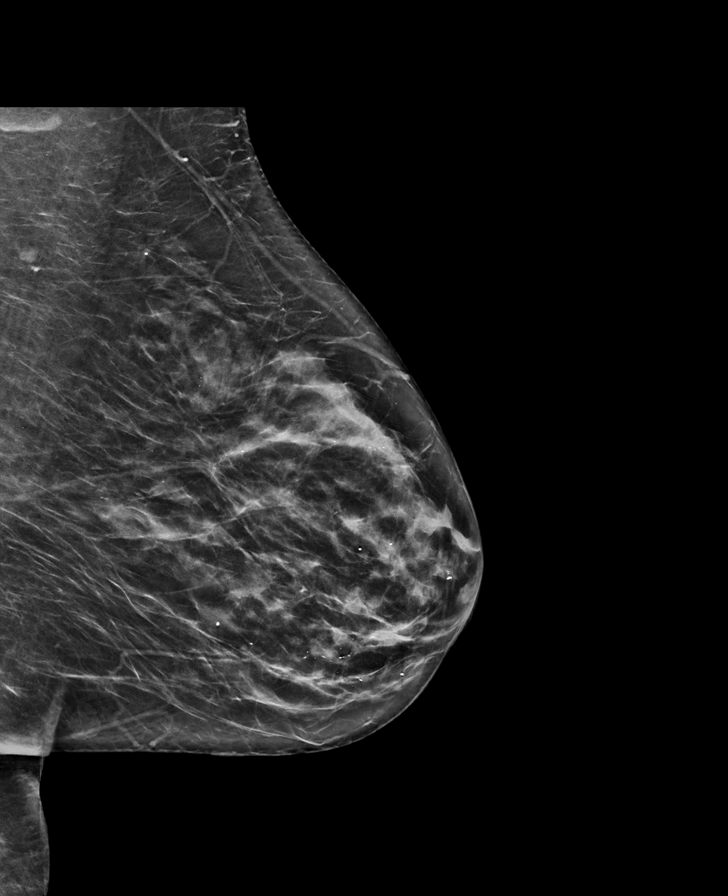

[R MLO synth-2D]
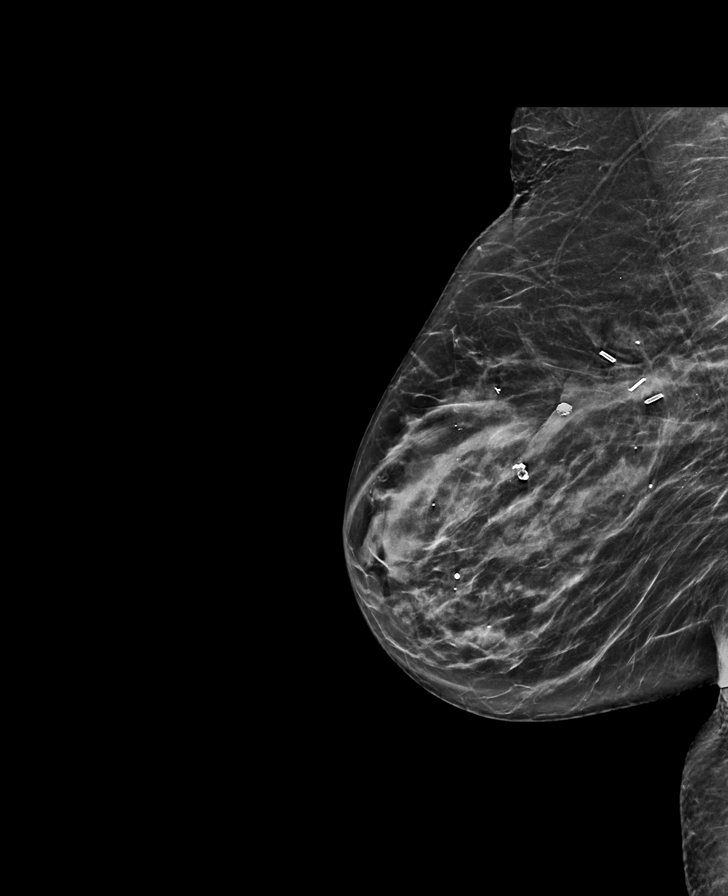

[R XCCL synth-2D]
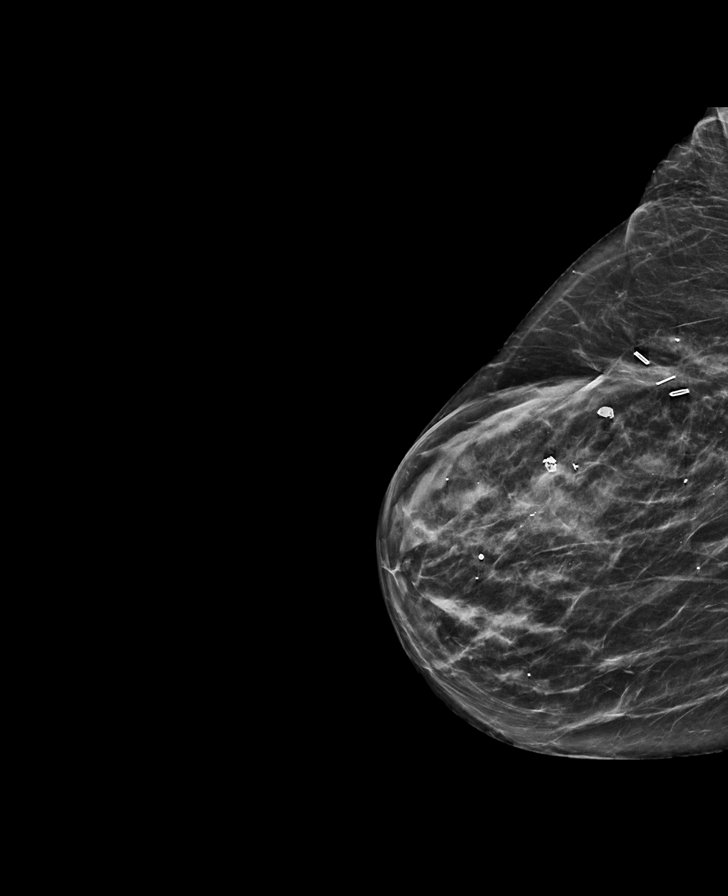

[R XCCL tomo · tomo slice 31/60.0]
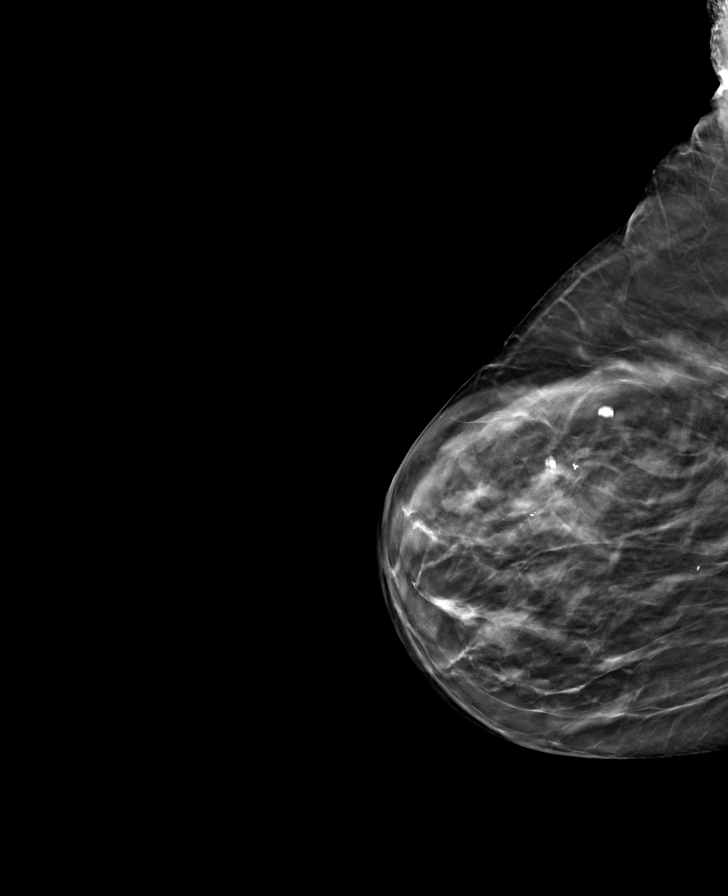

[8 of 32 positions shown; findings below may reference images not displayed]

ACR Breast Density Category c: The breast tissue is heterogeneously
dense, which may obscure small masses.
FINDINGS: Tomosynthesis and synthesized full field CC and MLO views of both
breasts were obtained. Standard spot magnification MLO views of the
lumpectomy site and a tomosynthesis and synthesized full field
laterally exaggerated CC view of the RIGHT breast were obtained.

Post surgical scar/architectural distortion at the lumpectomy site
in the OUTER RIGHT breast at MIDDLE to POSTERIOR depth. No new or
suspicious findings in the RIGHT breast.

Post surgical scar/architectural distortion in the OUTER LEFT breast
at the site of the prior benign excisional biopsy. No new or
suspicious findings in the LEFT breast.

Mammographic images were processed with CAD.
IMPRESSION: 1. No mammographic evidence of malignancy involving either breast.
2. Expected post lumpectomy changes involving the RIGHT breast.
3. Expected post surgical changes involving the LEFT breast.

RECOMMENDATION:
BILATERAL diagnostic mammography in 1 year.

I have discussed the findings and recommendations with the patient.
If applicable, a reminder letter will be sent to the patient
regarding the next appointment.

BI-RADS CATEGORY  2: Benign.

## 2021-04-15 ENCOUNTER — Other Ambulatory Visit: Payer: Self-pay | Admitting: Endocrinology

## 2021-04-15 DIAGNOSIS — Z1231 Encounter for screening mammogram for malignant neoplasm of breast: Secondary | ICD-10-CM

## 2021-05-17 ENCOUNTER — Ambulatory Visit
Admission: RE | Admit: 2021-05-17 | Discharge: 2021-05-17 | Disposition: A | Discharge status: Home / Self Care | Payer: Medicare PPO | Source: Ambulatory Visit | Attending: Endocrinology | Admitting: Endocrinology

## 2021-05-17 DIAGNOSIS — Z1231 Encounter for screening mammogram for malignant neoplasm of breast: Secondary | ICD-10-CM

## 2022-02-07 DIAGNOSIS — R7989 Other specified abnormal findings of blood chemistry: Secondary | ICD-10-CM | POA: Diagnosis not present

## 2022-02-07 DIAGNOSIS — E1142 Type 2 diabetes mellitus with diabetic polyneuropathy: Secondary | ICD-10-CM | POA: Diagnosis not present

## 2022-02-07 DIAGNOSIS — E785 Hyperlipidemia, unspecified: Secondary | ICD-10-CM | POA: Diagnosis not present

## 2022-02-07 DIAGNOSIS — I10 Essential (primary) hypertension: Secondary | ICD-10-CM | POA: Diagnosis not present

## 2022-02-14 DIAGNOSIS — Z1389 Encounter for screening for other disorder: Secondary | ICD-10-CM | POA: Diagnosis not present

## 2022-02-14 DIAGNOSIS — E785 Hyperlipidemia, unspecified: Secondary | ICD-10-CM | POA: Diagnosis not present

## 2022-02-14 DIAGNOSIS — I129 Hypertensive chronic kidney disease with stage 1 through stage 4 chronic kidney disease, or unspecified chronic kidney disease: Secondary | ICD-10-CM | POA: Diagnosis not present

## 2022-02-14 DIAGNOSIS — R82998 Other abnormal findings in urine: Secondary | ICD-10-CM | POA: Diagnosis not present

## 2022-02-14 DIAGNOSIS — G4700 Insomnia, unspecified: Secondary | ICD-10-CM | POA: Diagnosis not present

## 2022-02-14 DIAGNOSIS — Z8673 Personal history of transient ischemic attack (TIA), and cerebral infarction without residual deficits: Secondary | ICD-10-CM | POA: Diagnosis not present

## 2022-02-14 DIAGNOSIS — E1142 Type 2 diabetes mellitus with diabetic polyneuropathy: Secondary | ICD-10-CM | POA: Diagnosis not present

## 2022-02-14 DIAGNOSIS — Z853 Personal history of malignant neoplasm of breast: Secondary | ICD-10-CM | POA: Diagnosis not present

## 2022-02-14 DIAGNOSIS — N1831 Chronic kidney disease, stage 3a: Secondary | ICD-10-CM | POA: Diagnosis not present

## 2022-02-14 DIAGNOSIS — Z0000 Encounter for general adult medical examination without abnormal findings: Secondary | ICD-10-CM | POA: Diagnosis not present

## 2022-02-14 DIAGNOSIS — Z1331 Encounter for screening for depression: Secondary | ICD-10-CM | POA: Diagnosis not present

## 2022-02-14 DIAGNOSIS — Z23 Encounter for immunization: Secondary | ICD-10-CM | POA: Diagnosis not present

## 2022-02-14 DIAGNOSIS — I10 Essential (primary) hypertension: Secondary | ICD-10-CM | POA: Diagnosis not present

## 2022-02-14 DIAGNOSIS — I700 Atherosclerosis of aorta: Secondary | ICD-10-CM | POA: Diagnosis not present

## 2022-03-27 DIAGNOSIS — F331 Major depressive disorder, recurrent, moderate: Secondary | ICD-10-CM | POA: Diagnosis not present

## 2022-03-27 DIAGNOSIS — F5101 Primary insomnia: Secondary | ICD-10-CM | POA: Diagnosis not present

## 2022-04-21 ENCOUNTER — Other Ambulatory Visit: Payer: Self-pay | Admitting: Endocrinology

## 2022-04-21 DIAGNOSIS — Z1231 Encounter for screening mammogram for malignant neoplasm of breast: Secondary | ICD-10-CM

## 2022-06-16 ENCOUNTER — Ambulatory Visit: Payer: Medicare PPO

## 2022-08-01 ENCOUNTER — Ambulatory Visit
Admission: RE | Admit: 2022-08-01 | Discharge: 2022-08-01 | Disposition: A | Discharge status: Home / Self Care | Payer: Medicare PPO | Source: Ambulatory Visit | Attending: Endocrinology | Admitting: Endocrinology

## 2022-08-01 DIAGNOSIS — Z1231 Encounter for screening mammogram for malignant neoplasm of breast: Secondary | ICD-10-CM | POA: Diagnosis not present

## 2022-08-14 DIAGNOSIS — N1831 Chronic kidney disease, stage 3a: Secondary | ICD-10-CM | POA: Diagnosis not present

## 2022-08-14 DIAGNOSIS — Z8673 Personal history of transient ischemic attack (TIA), and cerebral infarction without residual deficits: Secondary | ICD-10-CM | POA: Diagnosis not present

## 2022-08-14 DIAGNOSIS — I2584 Coronary atherosclerosis due to calcified coronary lesion: Secondary | ICD-10-CM | POA: Diagnosis not present

## 2022-08-14 DIAGNOSIS — E1142 Type 2 diabetes mellitus with diabetic polyneuropathy: Secondary | ICD-10-CM | POA: Diagnosis not present

## 2022-08-14 DIAGNOSIS — I700 Atherosclerosis of aorta: Secondary | ICD-10-CM | POA: Diagnosis not present

## 2022-08-14 DIAGNOSIS — I129 Hypertensive chronic kidney disease with stage 1 through stage 4 chronic kidney disease, or unspecified chronic kidney disease: Secondary | ICD-10-CM | POA: Diagnosis not present

## 2022-08-14 DIAGNOSIS — G4700 Insomnia, unspecified: Secondary | ICD-10-CM | POA: Diagnosis not present

## 2022-08-14 DIAGNOSIS — E785 Hyperlipidemia, unspecified: Secondary | ICD-10-CM | POA: Diagnosis not present

## 2022-09-23 DIAGNOSIS — M1712 Unilateral primary osteoarthritis, left knee: Secondary | ICD-10-CM | POA: Diagnosis not present

## 2022-10-23 DIAGNOSIS — M1712 Unilateral primary osteoarthritis, left knee: Secondary | ICD-10-CM | POA: Diagnosis not present

## 2022-12-23 DIAGNOSIS — M25562 Pain in left knee: Secondary | ICD-10-CM | POA: Diagnosis not present

## 2022-12-23 DIAGNOSIS — M1712 Unilateral primary osteoarthritis, left knee: Secondary | ICD-10-CM | POA: Diagnosis not present
